# Patient Record
Sex: Male | Born: 1965
Health system: Southern US, Community
[De-identification: ages and names within clinical notes are randomized; demographics above are authoritative.]

## PROBLEM LIST (undated history)

## (undated) DIAGNOSIS — E119 Type 2 diabetes mellitus without complications: Secondary | ICD-10-CM

## (undated) DIAGNOSIS — M549 Dorsalgia, unspecified: Secondary | ICD-10-CM

## (undated) DIAGNOSIS — E559 Vitamin D deficiency, unspecified: Secondary | ICD-10-CM

## (undated) DIAGNOSIS — E1121 Type 2 diabetes mellitus with diabetic nephropathy: Secondary | ICD-10-CM

## (undated) DIAGNOSIS — J189 Pneumonia, unspecified organism: Secondary | ICD-10-CM

## (undated) DIAGNOSIS — E785 Hyperlipidemia, unspecified: Secondary | ICD-10-CM

## (undated) DIAGNOSIS — R7303 Prediabetes: Secondary | ICD-10-CM

## (undated) HISTORY — PX: ROTATOR CUFF REPAIR: SHX139

## (undated) HISTORY — DX: Vitamin D deficiency, unspecified: E55.9

## (undated) HISTORY — DX: Type 2 diabetes mellitus with diabetic nephropathy: E11.21

## (undated) HISTORY — PX: HAND SURGERY: SHX662

## (undated) HISTORY — PX: EYE SURGERY: SHX253

## (undated) HISTORY — PX: REPAIR KNEE LIGAMENT: SUR1188

## (undated) HISTORY — DX: Hyperlipidemia, unspecified: E78.5

---

## 2002-03-13 ENCOUNTER — Encounter: Admission: RE | Admit: 2002-03-13 | Discharge: 2002-04-19 | Payer: Self-pay | Admitting: Family Medicine

## 2006-07-26 ENCOUNTER — Emergency Department (HOSPITAL_COMMUNITY): Admission: EM | Admit: 2006-07-26 | Discharge: 2006-07-26 | Payer: Self-pay | Admitting: Emergency Medicine

## 2007-05-16 ENCOUNTER — Emergency Department (HOSPITAL_COMMUNITY): Admission: EM | Admit: 2007-05-16 | Discharge: 2007-05-17 | Payer: Self-pay | Admitting: Emergency Medicine

## 2007-09-18 ENCOUNTER — Emergency Department (HOSPITAL_COMMUNITY): Admission: EM | Admit: 2007-09-18 | Discharge: 2007-09-18 | Payer: Self-pay | Admitting: Emergency Medicine

## 2007-09-19 ENCOUNTER — Emergency Department (HOSPITAL_COMMUNITY): Admission: EM | Admit: 2007-09-19 | Discharge: 2007-09-19 | Payer: Self-pay | Admitting: Emergency Medicine

## 2009-02-01 ENCOUNTER — Emergency Department (HOSPITAL_COMMUNITY): Admission: EM | Admit: 2009-02-01 | Discharge: 2009-02-01 | Payer: Self-pay | Admitting: Emergency Medicine

## 2010-08-29 LAB — URINALYSIS, ROUTINE W REFLEX MICROSCOPIC
Leukocytes, UA: NEGATIVE
Nitrite: NEGATIVE
Specific Gravity, Urine: 1.03 (ref 1.005–1.030)
Urobilinogen, UA: 0.2 mg/dL (ref 0.0–1.0)
pH: 5 (ref 5.0–8.0)

## 2010-08-29 LAB — URINE MICROSCOPIC-ADD ON

## 2010-08-29 LAB — GLUCOSE, CAPILLARY: Glucose-Capillary: 279 mg/dL — ABNORMAL HIGH (ref 70–99)

## 2010-10-23 ENCOUNTER — Emergency Department (HOSPITAL_COMMUNITY)
Admission: EM | Admit: 2010-10-23 | Discharge: 2010-10-23 | Disposition: A | Discharge status: Home / Self Care | Payer: Self-pay | Attending: Emergency Medicine | Admitting: Emergency Medicine

## 2010-10-23 DIAGNOSIS — E119 Type 2 diabetes mellitus without complications: Secondary | ICD-10-CM | POA: Insufficient documentation

## 2010-10-23 DIAGNOSIS — K089 Disorder of teeth and supporting structures, unspecified: Secondary | ICD-10-CM | POA: Insufficient documentation

## 2012-05-24 ENCOUNTER — Emergency Department (HOSPITAL_COMMUNITY)
Admission: EM | Admit: 2012-05-24 | Discharge: 2012-05-24 | Disposition: A | Discharge status: Home / Self Care | Payer: Self-pay | Attending: Emergency Medicine | Admitting: Emergency Medicine

## 2012-05-24 ENCOUNTER — Emergency Department (HOSPITAL_COMMUNITY): Payer: Self-pay

## 2012-05-24 ENCOUNTER — Encounter (HOSPITAL_COMMUNITY): Payer: Self-pay | Admitting: *Deleted

## 2012-05-24 DIAGNOSIS — Z8701 Personal history of pneumonia (recurrent): Secondary | ICD-10-CM | POA: Insufficient documentation

## 2012-05-24 DIAGNOSIS — R059 Cough, unspecified: Secondary | ICD-10-CM | POA: Insufficient documentation

## 2012-05-24 DIAGNOSIS — J3489 Other specified disorders of nose and nasal sinuses: Secondary | ICD-10-CM | POA: Insufficient documentation

## 2012-05-24 DIAGNOSIS — R05 Cough: Secondary | ICD-10-CM

## 2012-05-24 DIAGNOSIS — R52 Pain, unspecified: Secondary | ICD-10-CM | POA: Insufficient documentation

## 2012-05-24 HISTORY — DX: Prediabetes: R73.03

## 2012-05-24 HISTORY — DX: Pneumonia, unspecified organism: J18.9

## 2012-05-24 HISTORY — DX: Dorsalgia, unspecified: M54.9

## 2012-05-24 MED ORDER — ALBUTEROL SULFATE HFA 108 (90 BASE) MCG/ACT IN AERS
2.0000 | INHALATION_SPRAY | Freq: Four times a day (QID) | RESPIRATORY_TRACT | Status: DC
  Filled 2012-05-24: qty 6.7

## 2012-05-24 MED ORDER — ALBUTEROL SULFATE (5 MG/ML) 0.5% IN NEBU
2.5000 mg | INHALATION_SOLUTION | RESPIRATORY_TRACT | Status: AC
  Administered 2012-05-24: 2.5 mg via RESPIRATORY_TRACT
  Filled 2012-05-24: qty 0.5

## 2012-05-24 MED ORDER — HYDROCODONE-HOMATROPINE 5-1.5 MG PO TABS: 1.0000 | ORAL_TABLET | Freq: Four times a day (QID) | ORAL | Status: DC | PRN

## 2012-05-24 NOTE — ED Notes (Signed)
Pt developed cough overnight, aches, fever, denies n/v/d, does have decreased appetite.

## 2012-05-24 NOTE — ED Provider Notes (Signed)
History  This chart was scribed for Brendan Munch, MD by Bennett Scrape, ED Scribe. This patient was seen in room APA19/APA19 and the patient's care was started at 3:46 PM.   CSN: [id number]  Arrival date & time 05/24/12  1512   First MD Initiated Contact with Patient 05/24/12 1546      Chief Complaint  Patient presents with  . Cough  . Nasal Congestion  . Generalized Body Aches    The history is provided by the patient. No language interpreter was used.    ORION MOLE is a 46 y.o. male who presents to the Emergency Department complaining of approximately 24 hours of gradual onset, gradually worsening, constant non-productive cough with associated low grade fever of 99 and diffuse HA. He reports chest pain and back pain with coughing but none without coughing. He states that the symptoms are similar to a prior PNA diagnosis 10 years ago. He reports sick contacts at home with similar symptoms.  He denies abdominal pain, diarrhea, nausea, emesis, confusion and CP without coughing as associated symptoms. He does not have a h/o chronic medical conditions. He is an occasional alcohol user but denies smoking.  Past Medical History  Diagnosis Date  . Back pain   . Pneumonia   . Pre-diabetes     Past Surgical History  Procedure Date  . Repair knee ligament     rt  . Rotator cuff repair     rt  . Hand surgery     rt    History reviewed. No pertinent family history.  History  Substance Use Topics  . Smoking status: Never Smoker   . Smokeless tobacco: Not on file  . Alcohol Use: Yes      Review of Systems  Constitutional:       Per HPI, otherwise negative  HENT:       Per HPI, otherwise negative  Eyes: Negative.   Respiratory:       Per HPI, otherwise negative  Cardiovascular:       Per HPI, otherwise negative  Gastrointestinal: Negative for vomiting.  Genitourinary: Negative.   Musculoskeletal:       Per HPI, otherwise negative  Skin: Negative.     Neurological: Positive for headaches. Negative for syncope.    Allergies  Review of patient's allergies indicates no known allergies.  Home Medications  No current outpatient prescriptions on file.  BP 150/84  Pulse 89  Temp 98.6 F (37 C) (Oral)  Resp 20  Ht 5\' 7"  (1.702 m)  Wt 210 lb (95.255 kg)  BMI 32.89 kg/m2  SpO2 100%  Physical Exam  Nursing note and vitals reviewed. Constitutional: He is oriented to person, place, and time. He appears well-developed and well-nourished. No distress.  HENT:  Head: Normocephalic and atraumatic.  Mouth/Throat: Oropharynx is clear and moist.  Eyes: Conjunctivae normal and EOM are normal. Pupils are equal, round, and reactive to light.  Cardiovascular: Normal rate and regular rhythm.   Pulmonary/Chest: Effort normal and breath sounds normal. No stridor. No respiratory distress.  Abdominal: Soft. He exhibits no distension.  Musculoskeletal: Normal range of motion. He exhibits no edema.  Neurological: He is alert and oriented to person, place, and time.  Skin: Skin is warm and dry.  Psychiatric: He has a normal mood and affect.    ED Course  Procedures (including critical care time)  DIAGNOSTIC STUDIES: Oxygen Saturation is 100% on room air, normal by my interpretation.    COORDINATION OF CARE: 4:02  PM- Discussed treatment plan which includes CXR and breathing treatment with pt at bedside and pt agreed to plan.  4:30 PM- Ordered 2.5 mg of 0.5% albuterol nebulizer solution.   Labs Reviewed - No data to display Dg Chest 2 View  05/24/2012  *RADIOLOGY REPORT*  Clinical Data: Shortness of breath with cough and fever.  CHEST - 2 VIEW  Comparison: 07/26/2006  Findings: The lungs are clear without focal consolidation, edema, effusion or pneumothorax.  Cardiopericardial silhouette is within normal limits for size.  Imaged bony structures of the thorax are intact.  IMPRESSION: Normal exam.   Original Report Authenticated By: Kennith Center,  M.D.      No diagnosis found.   On re-exam the patient is improved.  He remains HD stable MDM  This generally well-appearing male presents with one day of URI like symptoms.  On exam he is clinically stable, with no fever, mentating well.  The patient has clear lung sounds and with a negative x-ray, and clinical improvement, is appropriate for discharged with symptomatic treatment, PND followup of her presumed viral URI versus bronchitis.       Brendan Munch, MD 05/25/12 2110

## 2012-05-24 NOTE — ED Notes (Signed)
Pt states became sick with cough, low grade fever,headache and generalized body aches x 24 hrs. Denies n/v/d. Pt also denies chest pain without coughing. Pt is alert, oriented x 4 . NAD noted.  Dr Jeraldine Loots at bedside.

## 2014-12-11 ENCOUNTER — Emergency Department (HOSPITAL_COMMUNITY)
Admission: EM | Admit: 2014-12-11 | Discharge: 2014-12-11 | Disposition: A | Discharge status: Home / Self Care | Payer: Self-pay | Attending: Emergency Medicine | Admitting: Emergency Medicine

## 2014-12-11 ENCOUNTER — Encounter (HOSPITAL_COMMUNITY): Payer: Self-pay | Admitting: Emergency Medicine

## 2014-12-11 DIAGNOSIS — K029 Dental caries, unspecified: Secondary | ICD-10-CM | POA: Insufficient documentation

## 2014-12-11 DIAGNOSIS — K088 Other specified disorders of teeth and supporting structures: Secondary | ICD-10-CM | POA: Insufficient documentation

## 2014-12-11 DIAGNOSIS — K1379 Other lesions of oral mucosa: Secondary | ICD-10-CM | POA: Insufficient documentation

## 2014-12-11 DIAGNOSIS — K0889 Other specified disorders of teeth and supporting structures: Secondary | ICD-10-CM

## 2014-12-11 DIAGNOSIS — Z8701 Personal history of pneumonia (recurrent): Secondary | ICD-10-CM | POA: Insufficient documentation

## 2014-12-11 MED ORDER — PENICILLIN V POTASSIUM 250 MG PO TABS: 250.0000 mg | ORAL_TABLET | Freq: Four times a day (QID) | ORAL | Status: AC

## 2014-12-11 MED ORDER — IBUPROFEN 400 MG PO TABS
800.0000 mg | ORAL_TABLET | Freq: Once | ORAL | Status: AC
  Administered 2014-12-11: 800 mg via ORAL
  Filled 2014-12-11: qty 2

## 2014-12-11 MED ORDER — PENICILLIN V POTASSIUM 250 MG PO TABS
500.0000 mg | ORAL_TABLET | Freq: Once | ORAL | Status: AC
  Administered 2014-12-11: 500 mg via ORAL
  Filled 2014-12-11: qty 2

## 2014-12-11 NOTE — Discharge Instructions (Signed)
D[facility] Pain [facility] for [facility] pain and recurrent abscesses. Return for fever or facial swelling. A tooth ache may be caused by cavities (tooth decay). Cavities expose the nerve of the tooth to air and hot or cold temperatures. It may come from an infection or abscess (also called a boil or furuncle) around your tooth. It is also often caused by dental caries (tooth decay). This causes the pain you are having. DIAGNOSIS  Your caregiver can diagnose this problem by exam. TREATMENT   If caused by an infection, it may be treated with medications which kill germs (antibiotics) and pain medications as prescribed by your caregiver. Take medications as directed.  Only take over-the-counter or prescription medicines for pain, discomfort, or fever as directed by your caregiver.  Whether the tooth ache today is caused by infection or dental disease, you should see your dentist as soon as possible for further care. SEEK MEDICAL CARE IF: The exam and treatment you received today has been provided on an emergency basis only. This is not a substitute for complete medical or dental care. If your problem worsens or new problems (symptoms) appear, and you are unable to meet with your dentist, call or return to this location. SEEK IMMEDIATE MEDICAL CARE IF:   You have a fever.  You develop redness and swelling of your face, jaw, or neck.  You are unable to open your mouth.  You have severe pain uncontrolled by pain medicine. MAKE SURE YOU:   Understand these instructions.  Will watch your condition.  Will get help right away if you are not doing well or get worse. Document Released: 05/11/2005 Document Revised: 08/03/2011 Document Reviewed: 12/28/2007 [facility] Patient Information 2015 [employer] [place]. This information is not intended to replace advice given to you by your health care provider. Make sure you discuss any questions you have with your health care provider.
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
## 2014-12-11 NOTE — ED Provider Notes (Signed)
CSN: [id number]     Arrival date & time 12/11/14  3086 History  This chart was scribed for non-physician practitioner Catha Gosselin, PA-C working with Purvis Sheffield, MD by Lyndel Safe, ED Scribe. This patient was seen in room TR09C/TR09C and the patient's care was started at 10:05 AM.    Chief Complaint  Patient presents with  . Dental Pain   The history is provided by the patient. No language interpreter was used.   HPI Comments: Brendan Stone is a 49 y.o. male who presents to the Emergency Department complaining of a gradually worsening areas of pain and swelling in oral region onset 7 days ago. Pt reports he also has facial pain. His oral pain is exacerbated with touch. The pt has been popping the areas of infection with his finger and gargling with salt water. He reports he has a prior history of dental abscesses and poor dentition. He started taking a keflex prescription that he was prescribed on a prior visit to the ED as of yesterday. Pt states he is not followed by a dentist. He denies fevers or PMhx of renal issues/complications.    Past Medical History  Diagnosis Date  . Back pain   . Pneumonia   . Pre-diabetes    Past Surgical History  Procedure Laterality Date  . Repair knee ligament      rt  . Rotator cuff repair      rt  . Hand surgery      rt   No family history on file. History  Substance Use Topics  . Smoking status: Never Smoker   . Smokeless tobacco: Not on file  . Alcohol Use: Yes    Review of Systems  Constitutional: Negative for fever.  HENT: Positive for dental problem and mouth sores.    Allergies  Review of patient's allergies indicates no known allergies.  Home Medications   Prior to Admission medications   Medication Sig Start Date End Date Taking? Authorizing Provider  Hydrocodone-Homatropine 5-1.5 MG TABS Take 1 tablet by mouth every 6 (six) hours as needed. 05/24/12   Gerhard Munch, MD  penicillin v potassium (VEETID) 250 MG  tablet Take 1 tablet (250 mg total) by mouth 4 (four) times daily. 12/11/14 12/18/14  Delorese Sellin Patel-Mills, PA-C   BP 155/96 mmHg  Pulse 89  Temp(Src) 97.5 F (36.4 C) (Oral)  Resp 20  Ht  (1.727 m)  Wt 220 lb (99.791 kg)  BMI 33.46 kg/m2  SpO2 99% Physical Exam  Constitutional: He is oriented to person, place, and time. He appears well-developed and well-nourished. No distress.  HENT:  Head: Normocephalic and atraumatic.  Mouth/Throat: Uvula is midline, oropharynx is clear and moist and mucous membranes are normal. No trismus in the jaw. Abnormal dentition. Dental caries present. No dental abscesses or uvula swelling.  Dental carries throughout; no gum swelling; no facial swelling; no palpable fluctuance or abscess; TTP of the anterior upper gums; no new tooth avulsion or fracture.   Eyes: Conjunctivae are normal. Right eye exhibits no discharge. Left eye exhibits no discharge. No scleral icterus.  Neck: Neck supple. No JVD present.  Cardiovascular: Normal rate.   Pulmonary/Chest: Effort normal. No respiratory distress.  Patent airway. No difficulty breathing or shortness of breath. No drooling. No trismus.  Abdominal: Soft.  Musculoskeletal: Normal range of motion.  Neurological: He is alert and oriented to person, place, and time. Coordination normal.  Skin: Skin is warm and dry. No rash noted. No erythema. No pallor.  Psychiatric: He has a normal mood and affect. His behavior is normal.  Nursing note and vitals reviewed.  ED Course  Procedures  DIAGNOSTIC STUDIES: Oxygen Saturation is 99% on RA, normal by my interpretation.    COORDINATION OF CARE: 10:10 AM Discussed treatment plan which includes to order and prescribe penicillin and ibuprofen with pt. Will also give referral to a dentist. Pt acknowledges and agrees to plan.   Labs Review Labs Reviewed - No data to display  Imaging Review No results found.   EKG Interpretation None      MDM   Final diagnoses:   Pain, dental  Patient presents for dental pain. He states he gets recurrent gum abscesses and pops them himself. Medications  penicillin v potassium (VEETID) tablet 500 mg (500 mg Oral Given 12/11/14 1022)  ibuprofen (ADVIL,MOTRIN) tablet 800 mg (800 mg Oral Given 12/11/14 1022)  Today, he has no visible abscess but this could be due to him popping these areas with his nails. He has no signs of Ludwigs angina. I have not given him narcotic pain medication to go home with and he verbally agrees with the plan. I have however given him penicillin V and dentistry referral. I also gave the patient return precautions. I personally performed the services described in this documentation, which was scribed in my presence. The recorded information has been reviewed and is accurate.   Catha GosselinHanna Patel-Mills, PA-C 12/11/14 1642  Purvis SheffieldForrest Harrison, MD 12/12/14 [phone]

## 2014-12-11 NOTE — ED Notes (Signed)
Declined W/C at D/C and was escorted to lobby by RN. 

## 2015-03-23 ENCOUNTER — Encounter (HOSPITAL_COMMUNITY): Payer: Self-pay | Admitting: Emergency Medicine

## 2015-03-23 DIAGNOSIS — W1781XA Fall down embankment (hill), initial encounter: Secondary | ICD-10-CM | POA: Insufficient documentation

## 2015-03-23 DIAGNOSIS — S138XXA Sprain of joints and ligaments of other parts of neck, initial encounter: Secondary | ICD-10-CM | POA: Insufficient documentation

## 2015-03-23 DIAGNOSIS — Y998 Other external cause status: Secondary | ICD-10-CM | POA: Insufficient documentation

## 2015-03-23 DIAGNOSIS — Z8701 Personal history of pneumonia (recurrent): Secondary | ICD-10-CM | POA: Insufficient documentation

## 2015-03-23 DIAGNOSIS — S4992XA Unspecified injury of left shoulder and upper arm, initial encounter: Secondary | ICD-10-CM | POA: Insufficient documentation

## 2015-03-23 DIAGNOSIS — Y9289 Other specified places as the place of occurrence of the external cause: Secondary | ICD-10-CM | POA: Insufficient documentation

## 2015-03-23 DIAGNOSIS — Y9389 Activity, other specified: Secondary | ICD-10-CM | POA: Insufficient documentation

## 2015-03-23 DIAGNOSIS — S4991XA Unspecified injury of right shoulder and upper arm, initial encounter: Secondary | ICD-10-CM | POA: Insufficient documentation
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
## 2015-03-24 ENCOUNTER — Emergency Department (HOSPITAL_COMMUNITY)
Admission: EM | Admit: 2015-03-24 | Discharge: 2015-03-24 | Disposition: A | Discharge status: Home / Self Care | Payer: Self-pay | Attending: Emergency Medicine | Admitting: Emergency Medicine

## 2015-03-24 ENCOUNTER — Emergency Department (HOSPITAL_COMMUNITY): Payer: Self-pay

## 2015-03-24 DIAGNOSIS — S139XXA Sprain of joints and ligaments of unspecified parts of neck, initial encounter: Secondary | ICD-10-CM

## 2015-03-24 MED ORDER — HYDROCODONE-ACETAMINOPHEN 5-325 MG PO TABS: 1.0000 | ORAL_TABLET | ORAL | Status: DC | PRN

## 2015-03-24 MED ORDER — NAPROXEN 375 MG PO TABS: 375.0000 mg | ORAL_TABLET | Freq: Two times a day (BID) | ORAL | Status: DC

## 2015-03-24 MED ORDER — CYCLOBENZAPRINE HCL 10 MG PO TABS
5.0000 mg | ORAL_TABLET | Freq: Two times a day (BID) | ORAL | Status: DC | PRN
Start: 2015-03-24 — End: 2016-03-28

## 2015-03-24 MED ORDER — ACETAMINOPHEN 325 MG PO TABS
650.0000 mg | ORAL_TABLET | Freq: Once | ORAL | Status: AC
  Administered 2015-03-24: 650 mg via ORAL
  Filled 2015-03-24: qty 2

## 2015-03-24 NOTE — ED Notes (Signed)
Patient transported to CT 

## 2015-03-24 NOTE — Discharge Instructions (Signed)
Cervical Collar A cervical collar is a device that supports your chin and the back of your head. It is used after a severe neck injury to protect your head and neck. It does this by restricting the movement of the top part of your spine, which is located in your neck. A cervical collar may be used when you have:  A fractured neck.  Ligament damage.  A spinal cord injury. WHAT INSTRUCTIONS SHOULD I FOLLOW?  Wear the collar for as long as your health care provider instructs.  Follow your health care provider's instructions about how to put on and take off your collar.  Do not make your collar so tight that you feel pain or it is hard for you to breathe.  Do not remove the collar unless your health care provider says it is okay. Ask your health care provider if you can remove the collar for showering or eating or to apply ice.  Do not drive a car until your health care provider says it is okay.  Keep all follow-up visits as directed by your health care provider. This is important. Any delay in getting necessary care can keep your injury from healing properly.  Apply ice to the injured area:  Put ice in a plastic bag.  Place a towel between your skin and the bag.  Leave the ice on for 20 minutes, 2-3 times per day for the first 2 days.   This information is not intended to replace advice given to you by your health care provider. Make sure you discuss any questions you have with your health care provider.   Document Released: 02/01/2004 Document Revised: 06/01/2014 Document Reviewed: 12/18/2013 Elsevier Interactive Patient Education 2016 [employer] Cervical Sprain A cervical sprain is an injury in the neck in which the strong, fibrous tissues (ligaments) that connect your neck bones stretch or tear. Cervical sprains can range from mild to severe. Severe cervical sprains can cause the neck vertebrae to be unstable. This can lead to damage of the spinal cord and can result in serious  nervous system problems. The amount of time it takes for a cervical sprain to get better depends on the cause and extent of the injury. Most cervical sprains heal in 1 to 3 weeks. CAUSES  Severe cervical sprains may be caused by:   Contact sport injuries (such as from football, rugby, wrestling, hockey, auto racing, gymnastics, diving, martial arts, or boxing).   Motor vehicle collisions.   Whiplash injuries. This is an injury from a sudden forward and backward whipping movement of the head and neck.  Falls.  Mild cervical sprains may be caused by:   Being in an awkward position, such as while cradling a telephone between your ear and shoulder.   Sitting in a chair that does not offer proper support.   Working at a poorly Marketing executivedesigned computer station.   Looking up or down for long periods of time.  SYMPTOMS   Pain, soreness, stiffness, or a burning sensation in the front, back, or sides of the neck. This discomfort may develop immediately after the injury or slowly, 24 hours or more after the injury.   Pain or tenderness directly in the middle of the back of the neck.   Shoulder or upper back pain.   Limited ability to move the neck.   Headache.   Dizziness.   Weakness, numbness, or tingling in the hands or arms.   Muscle spasms.   Difficulty swallowing or chewing.  Tenderness and swelling of the neck.  DIAGNOSIS  Most of the time your health care provider can diagnose a cervical sprain by taking your history and doing a physical exam. Your health care provider will ask about previous neck injuries and any known neck problems, such as arthritis in the neck. X-rays may be taken to find out if there are any other problems, such as with the bones of the neck. Other tests, such as a CT scan or MRI, may also be needed.  TREATMENT  Treatment depends on the severity of the cervical sprain. Mild sprains can be treated with rest, keeping the neck in place  (immobilization), and pain medicines. Severe cervical sprains are immediately immobilized. Further treatment is done to help with pain, muscle spasms, and other symptoms and may include:  Medicines, such as pain relievers, numbing medicines, or muscle relaxants.   Physical therapy. This may involve stretching exercises, strengthening exercises, and posture training. Exercises and improved posture can help stabilize the neck, strengthen muscles, and help stop symptoms from returning.  HOME CARE INSTRUCTIONS   Put ice on the injured area.   Put ice in a plastic bag.   Place a towel between your skin and the bag.   Leave the ice on for 15-20 minutes, 3-4 times a day.   If your injury was severe, you may have been given a cervical collar to wear. A cervical collar is a two-piece collar designed to keep your neck from moving while it heals.  Do not remove the collar unless instructed by your health care provider.  If you have long hair, keep it outside of the collar.  Ask your health care provider before making any adjustments to your collar. Minor adjustments may be required over time to improve comfort and reduce pressure on your chin or on the back of your head.  Ifyou are allowed to remove the collar for cleaning or bathing, follow your health care provider's instructions on how to do so safely.  Keep your collar clean by wiping it with mild soap and water and drying it completely. If the collar you have been given includes removable pads, remove them every 1-2 days and hand wash them with soap and water. Allow them to air dry. They should be completely dry before you wear them in the collar.  If you are allowed to remove the collar for cleaning and bathing, wash and dry the skin of your neck. Check your skin for irritation or sores. If you see any, tell your health care provider.  Do not drive while wearing the collar.   Only take over-the-counter or prescription medicines for  pain, discomfort, or fever as directed by your health care provider.   Keep all follow-up appointments as directed by your health care provider.   Keep all physical therapy appointments as directed by your health care provider.   Make any needed adjustments to your workstation to promote good posture.   Avoid positions and activities that make your symptoms worse.   Warm up and stretch before being active to help prevent problems.  SEEK MEDICAL CARE IF:   Your pain is not controlled with medicine.   You are unable to decrease your pain medicine over time as planned.   Your activity level is not improving as expected.  SEEK IMMEDIATE MEDICAL CARE IF:   You develop any bleeding.  You develop stomach upset.  You have signs of an allergic reaction to your medicine.   Your symptoms get  worse.   You develop new, unexplained symptoms.   You have numbness, tingling, weakness, or paralysis in any part of your body.  MAKE SURE YOU:   Understand these instructions.  Will watch your condition.  Will get help right away if you are not doing well or get worse.   This information is not intended to replace advice given to you by your health care provider. Make sure you discuss any questions you have with your health care provider.   Document Released: 03/08/2007 Document Revised: 05/16/2013 Document Reviewed: 11/16/2012 Elsevier Interactive Patient Education [employer]

## 2015-03-24 NOTE — ED Provider Notes (Signed)
CSN: [id number]     Arrival date & time 03/23/15  2243 History   First MD Initiated Contact with Patient 03/24/15 0100     Chief Complaint  Patient presents with  . Fall     (Consider location/radiation/quality/duration/timing/severity/associated sxs/prior Treatment) HPI  Brendan Stone is a 49 y.o. male  PCP: No primary care provider on file.  Blood pressure 137/82, pulse 86, temperature 98.2 F (36.8 C), temperature source Oral, resp. rate 16, height  (1.702 m), weight 232 lb (105.235 kg).  SIGNIFICANT PMH: back pain, pneumonia, pre-diabetes CHIEF COMPLAINT: fall, neck pain and upper back pain  The patient reports tripping and falling down 15-20 foot embankment, doing one flip, hearing his neck pop and then landing on his back. He did not have LOC, he did not injure his head. He says that he laid there for 30 minutes before getting up on his own and driving himself to the ER. He reports significant neck pain and bilateral shoulder blade pain. HE is not having any hip, low back, abdominal or chest wall pain. He denies having a headache. NO weakness, numbness or tingling to upper arms, no change of vision, wound injuries or chest pains. NO loss or bowel or bladder control  c-collar was placed in triage. Pt reports general myalgias.   Past Medical History  Diagnosis Date  . Back pain   . Pneumonia   . Pre-diabetes    Past Surgical History  Procedure Laterality Date  . Repair knee ligament      rt  . Rotator cuff repair      rt  . Hand surgery      rt   No family history on file. Social History  Substance Use Topics  . Smoking status: Never Smoker   . Smokeless tobacco: None  . Alcohol Use: Yes    Review of Systems  10 Systems reviewed and are negative for acute change except as noted in the HPI.    Allergies  Review of patient's allergies indicates no known allergies.  Home Medications   Prior to Admission medications   Medication Sig Start Date End  Date Taking? Authorizing Provider  Hydrocodone-Homatropine 5-1.5 MG TABS Take 1 tablet by mouth every 6 (six) hours as needed. 05/24/12   Gerhard Munch, MD   BP 137/82 mmHg  Pulse 86  Temp(Src) 98.2 F (36.8 C) (Oral)  Resp 16  Ht  (1.702 m)  Wt 232 lb (105.235 kg)  BMI 36.33 kg/m2 Physical Exam  Constitutional: Oriented to person, place, and time. Appears well-developed and well-nourished.  HENT:  Head: Normocephalic.  Eyes: EOM are normal.  Neck: Normal range of motion.   + paraspinal cervical tenderness and midline tenderss Patient is in c-collar. He reports that on the way to the ED he did not want to flex or rotate his neck due to severe pain. Physiologic and symmetrical strength to bilateral upper extremities. Pulmonary/Chest: Effort normal.  No contusions to chest wall No crepitus over neck or chest, no flail chest Abdominal: Soft. Exhibits no distension. There is no tenderness.  Anterior abdomen- No significant ecchymosis No flank tenderness, no ecchymosis abdominal wall.  Musculoskeletal: Normal range of motion.  No neurologic deficit No TTP of upper extremities No gross deformities No tenderness over the thoracic spine No new tenderness over the lumbar spine No step-offs  Neurological: Alert and oriented to person, place, and time.  Psychiatric: Has a normal mood and affect.  Nursing note and vitals reviewed.  ED Course  Procedures (including critical care time) Labs Review Labs Reviewed - No data to display  Imaging Review Dg Cervical Spine Complete  03/24/2015  CLINICAL DATA:  Pain following fall EXAM: CERVICAL SPINE - COMPLETE 4+ VIEW COMPARISON:  None. FINDINGS: Frontal, lateral, open-mouth odontoid, and bilateral oblique views were obtained with the patient's neck in collar. There is no demonstrable fracture or spondylolisthesis. Prevertebral soft tissues and predental space regions are normal. There is mild disc space narrowing at C5-6 and C6-7.  There are small anterior osteophytes at C5 and C6. There is no appreciable exit foraminal narrowing on the oblique views. There is mild upper thoracic levoscoliosis. IMPRESSION: No fracture or spondylolisthesis. Mild osteoarthritic change. Mild upper thoracic levoscoliosis. It should be noted that no attempt at assessment for potential ligamentous injury can be made with in collar only images. Electronically Signed   By: Bretta BangWilliam  Woodruff III M.D.   On: 03/24/2015 01:07   Dg Thoracic Spine 2 View  03/24/2015  CLINICAL DATA:  Pain following fall EXAM: THORACIC SPINE -3VIEWS COMPARISON:  None. FINDINGS: Frontal, lateral, and swimmer's views were obtained. There is slight thoracic levoscoliosis. There is no fracture or spondylolisthesis. There is mild to moderate disc space narrowing at multiple levels. There are multiple anterior lateral osteophytes. No erosive change. IMPRESSION: Osteoarthritic change at multiple levels. No fracture or spondylolisthesis. Slight scoliosis. Electronically Signed   By: Bretta BangWilliam  Woodruff III M.D.   On: 03/24/2015 01:08   I have personally reviewed and evaluated these images and lab results as part of my medical decision-making.   EKG Interpretation None      MDM   Final diagnoses:  None   Patient is having neck pain with decreased ROM, he had a plain film done per triage but since he has had a neck injury in the past it is not sufficient to r/o any injury. Pt still in rigid c-collar and a CT of the cervical spine has been ordered. Pt does not meet criteria for emergent MRI at this time with no decreased strength or neurological deficits.   He was given Tylenol for pain, declined any stronger of pain medication. At end of shift patient care handed off to Elpidio AnisShari Upstill, PA-C  If CT is not acute, would recommend order of soft c-collar for comfort.-- discussed plan with patient.     Marlon Peliffany Kiyanna Biegler, PA-C 03/24/15 [id number]  Layla MawKristen N Ward, DO 03/24/15 [id number]

## 2015-03-24 NOTE — ED Notes (Signed)
Patient transported to X-ray 

## 2015-11-24 ENCOUNTER — Encounter (HOSPITAL_COMMUNITY): Payer: Self-pay | Admitting: Emergency Medicine

## 2015-11-24 DIAGNOSIS — S60862A Insect bite (nonvenomous) of left wrist, initial encounter: Secondary | ICD-10-CM | POA: Insufficient documentation

## 2015-11-24 DIAGNOSIS — Y939 Activity, unspecified: Secondary | ICD-10-CM | POA: Insufficient documentation

## 2015-11-24 DIAGNOSIS — R110 Nausea: Secondary | ICD-10-CM | POA: Insufficient documentation

## 2015-11-24 DIAGNOSIS — M542 Cervicalgia: Secondary | ICD-10-CM | POA: Insufficient documentation

## 2015-11-24 DIAGNOSIS — Y999 Unspecified external cause status: Secondary | ICD-10-CM | POA: Insufficient documentation

## 2015-11-24 DIAGNOSIS — W57XXXA Bitten or stung by nonvenomous insect and other nonvenomous arthropods, initial encounter: Secondary | ICD-10-CM | POA: Insufficient documentation

## 2015-11-24 DIAGNOSIS — Y929 Unspecified place or not applicable: Secondary | ICD-10-CM | POA: Insufficient documentation

## 2015-11-24 NOTE — ED Notes (Signed)
Pt. reports insect bite to left wrist this evening with mild itching , reports nausea with pain at back of neck , denies fever /respirations unlabored /no oral swelling .

## 2015-11-25 ENCOUNTER — Emergency Department (HOSPITAL_COMMUNITY)
Admission: EM | Admit: 2015-11-25 | Discharge: 2015-11-25 | Disposition: A | Discharge status: Home / Self Care | Payer: Self-pay | Attending: Emergency Medicine | Admitting: Emergency Medicine

## 2015-11-25 DIAGNOSIS — R110 Nausea: Secondary | ICD-10-CM

## 2015-11-25 DIAGNOSIS — W57XXXA Bitten or stung by nonvenomous insect and other nonvenomous arthropods, initial encounter: Secondary | ICD-10-CM

## 2015-11-25 DIAGNOSIS — M542 Cervicalgia: Secondary | ICD-10-CM

## 2015-11-25 MED ORDER — IBUPROFEN 800 MG PO TABS
800.0000 mg | ORAL_TABLET | Freq: Once | ORAL | Status: AC
  Administered 2015-11-25: 800 mg via ORAL
  Filled 2015-11-25: qty 1

## 2015-11-25 MED ORDER — ONDANSETRON 4 MG PO TBDP
4.0000 mg | ORAL_TABLET | Freq: Once | ORAL | Status: AC
  Administered 2015-11-25: 4 mg via ORAL
  Filled 2015-11-25: qty 1

## 2015-11-25 NOTE — ED Provider Notes (Signed)
CSN: [id number]     Arrival date & time 11/24/15  2338 History   First MD Initiated Contact with Patient 11/25/15 0025     Chief Complaint  Patient presents with  . Insect Bite   HPI  Mr. Brendan Stone is a 50 year old male presenting with an insect bite, nausea and pain. Patient states that he was bitten by a small black spider on his left wrist earlier this evening. Denies that it was a black widow or brown recluse. He notes an area of redness to the wrist that has become pruritic. Denies the area of redness enlarging in size over the past 3 hours. Denies streaking or drainage from the insect bite. He states that soon after the insect bite he developed nausea without vomiting. He also notes that at that time he developed posterior neck pain. No history of neck injury. He describes this neck pain as a "muscle pull; like a tightness". The neck pain is not exacerbated by movement of the neck. The neck pain does not radiate. He denies numbness or weakness of the upper extremities. Denies known allergies. Denies headache, dizziness, syncope, facial swelling, sensation of throat closing, shortness of breath, wheezing, abdominal pain or diarrhea. He has not taken any medications prior to arrival. Denies fevers. He has no other complaints today.  Past Medical History  Diagnosis Date  . Back pain   . Pneumonia   . Pre-diabetes    Past Surgical History  Procedure Laterality Date  . Repair knee ligament      rt  . Rotator cuff repair      rt  . Hand surgery      rt   No family history on file. Social History  Substance Use Topics  . Smoking status: Never Smoker   . Smokeless tobacco: None  . Alcohol Use: Yes    Review of Systems  All other systems reviewed and are negative.     Allergies  Review of patient's allergies indicates no known allergies.  Home Medications   Prior to Admission medications   Medication Sig Start Date End Date Taking? Authorizing Provider  cyclobenzaprine (FLEXERIL)  10 MG tablet Take 0.5-1 tablets (5-10 mg total) by mouth 2 (two) times daily as needed. 03/24/15   Marlon Peliffany Greene, PA-C  HYDROcodone-acetaminophen (NORCO/VICODIN) 5-325 MG tablet Take 1-2 tablets by mouth every 4 (four) hours as needed. 03/24/15   Tiffany Neva SeatGreene, PA-C  Hydrocodone-Homatropine 5-1.5 MG TABS Take 1 tablet by mouth every 6 (six) hours as needed. 05/24/12   Gerhard Munchobert Lockwood, MD  naproxen (NAPROSYN) 375 MG tablet Take 1 tablet (375 mg total) by mouth 2 (two) times daily. 03/24/15   Tiffany Neva SeatGreene, PA-C   BP 130/92 mmHg  Pulse 92  Temp(Src) 98.1 F (36.7 C) (Oral)  Resp 18  Ht 5\' 8"  (1.727 m)  Wt 95.255 kg  BMI 31.94 kg/m2  SpO2 99% Physical Exam  Constitutional: He appears well-developed and well-nourished. No distress.  Nontoxic-appearing  HENT:  Head: Normocephalic and atraumatic.  Mouth/Throat: Oropharynx is clear and moist.  No eyelid, lip or tongue edema. Oropharynx is clear without edema or erythema. Uvula is midline without edema. Pt handling secretions well.   Eyes: Conjunctivae are normal. Right eye exhibits no discharge. Left eye exhibits no discharge.  Neck: Normal range of motion. Neck supple.  No tenderness to palpation of the posterior neck. FROM of neck intact without pain. No rigidity or meningeal signs.  Cardiovascular: Normal rate, regular rhythm and normal heart sounds.  Pulmonary/Chest: Effort normal and breath sounds normal. No respiratory distress. He has no wheezes. He has no rales.  Breathing unlabored. Lungs CTAB  Abdominal: Soft. He exhibits no distension. There is no tenderness.  Musculoskeletal: Normal range of motion.  Moves all extremities spontaneously  Neurological: He is alert. Coordination normal.  Skin: Skin is warm and dry. No rash noted.     Circular 3-4 millimeter erythematous papule noted to the left distal forearm radial aspect. No streaking. No central ulceration. No drainage, fluctuance or induration. Area is not tender to  palpation.  Psychiatric: He has a normal mood and affect. His behavior is normal.  Nursing note and vitals reviewed.   ED Course  Procedures (including critical care time) Labs Review Labs Reviewed - No data to display  Imaging Review No results found. I have personally reviewed and evaluated these images and lab results as part of my medical decision-making.   EKG Interpretation None      MDM   Final diagnoses:  Insect bite  Neck pain  Nausea   50 year old male presenting with pruritic bug bite with subsequent development of nausea and posterior neck pain. Afebrile and hemodynamically stable. Small erythematous papule noted to the left wrist without signs of superficial infection. Neck is nontender to palpation with full range of motion intact. No meningeal signs. Abdomen is soft, nontender. Patient is nontoxic, nonseptic appearing. No gross neuro deficits. Nausea and neck pain treated with Zofran and ibuprofen and emergency department. Recommended Benadryl for itching. Discussed signs and symptoms of skin infection for which patient should return to the emergency department. I have also given neck pain return precautions. Patient does not have a PCP. Given referral information for local primary care providers and instructed to follow up within the next week for a recheck of his bug bite and to insure symptom resolution.  At this time there does not appear to be any evidence of an acute emergency medical condition and the patient appears stable for discharge with appropriate outpatient follow up. Diagnosis was discussed with patient who verbalizes understanding and is agreeable to discharge. Return precautions given in discharge paperwork and discussed with pt at bedside. Pt is stable for discharge.    Rolm GalaStevi Jeancarlo Leffler, PA-C 11/25/15 [id number]  Tomasita CrumbleAdeleke Oni, MD 11/25/15 [phone]

## 2015-11-25 NOTE — Discharge Instructions (Signed)
Nausea, Adult Nausea is the feeling that you have an upset stomach or have to vomit. Nausea by itself is not likely a serious concern, but it may be an early sign of more serious medical problems. As nausea gets worse, it can lead to vomiting. If vomiting develops, there is the risk of dehydration.  CAUSES   Viral infections.  Food poisoning.  Medicines.  Pregnancy.  Motion sickness.  Migraine headaches.  Emotional distress.  Severe pain from any source.  Alcohol intoxication. HOME CARE INSTRUCTIONS  Get plenty of rest.  Ask your caregiver about specific rehydration instructions.  Eat small amounts of food and sip liquids more often.  Take all medicines as told by your caregiver. SEEK MEDICAL CARE IF:  You have not improved after 2 days, or you get worse.  You have a headache. SEEK IMMEDIATE MEDICAL CARE IF:   You have a fever.  You faint.  You keep vomiting or have blood in your vomit.  You are extremely weak or dehydrated.  You have dark or bloody stools.  You have severe chest or abdominal pain. MAKE SURE YOU:  Understand these instructions.  Will watch your condition.  Will get help right away if you are not doing well or get worse.   This information is not intended to replace advice given to you by your health care provider. Make sure you discuss any questions you have with your health care provider.   Document Released: 06/18/2004 Document Revised: 06/01/2014 Document Reviewed: 01/21/2011 Elsevier Interactive Patient Education 2016 [employer] Insect Bite Mosquitoes, flies, fleas, bedbugs, and many other insects can bite. Insect bites are different from insect stings. A sting is when poison (venom) is injected into the skin. Insect bites can cause pain or itching for a few days, but they are usually not serious. Some insects can spread diseases to people through a bite. SYMPTOMS  Symptoms of an insect bite include:  Itching or pain in the  bite area.  Redness and swelling in the bite area.  An open wound (skin ulcer). In many cases, symptoms last for 2-4 days.  DIAGNOSIS  This condition is usually diagnosed based on symptoms and a physical exam. TREATMENT  Treatment is usually not needed for an insect bite. Symptoms often go away on their own. Your health care provider may recommend creams or lotions to help reduce itching. Antibiotic medicines may be prescribed if the bite becomes infected. A tetanus shot may be given in some cases. If you develop an allergic reaction to an insect bite, your health care provider will prescribe medicines to treat the reaction (antihistamines). This is rare. HOME CARE INSTRUCTIONS  Do not scratch the bite area.  Keep the bite area clean and dry. Wash the bite area daily with soap and water as told by your health care provider.  If directed, applyice to the bite area.  Put ice in a plastic bag.  Place a towel between your skin and the bag.  Leave the ice on for 20 minutes, 2-3 times per day.  To help reduce itching and swelling, try applying a baking soda paste, cortisone cream, or calamine lotion to the bite area as told by your health care provider.  Apply or take over-the-counter and prescription medicines only as told by your health care provider.  If you were prescribed an antibiotic medicine, use it as told by your health care provider. Do not stop using the antibiotic even if your condition improves.  Keep all follow-up visits  as told by your health care provider. This is important. PREVENTION   Use insect repellent. The best insect repellents contain:  DEET, picaridin, oil of lemon eucalyptus (OLE), or IR3535.  Higher amounts of an active ingredient.  When you are outdoors, wear clothing that covers your arms and legs.  Avoid opening windows that do not have window screens. SEEK MEDICAL CARE IF:  You have increased redness, swelling, or pain in the bite area.  You  have a fever. SEEK IMMEDIATE MEDICAL CARE IF:   You have joint pain.   You have fluid, blood, or pus coming from the bite area.  You have a headache or neck pain.  You have unusual weakness.  You have a rash.  You have chest pain or shortness of breath.  You have abdominal pain, nausea, or vomiting.  You feel unusually tired or sleepy.   This information is not intended to replace advice given to you by your health care provider. Make sure you discuss any questions you have with your health care provider.   Document Released: 06/18/2004 Document Revised: 01/30/2015 Document Reviewed: 09/26/2014 Elsevier Interactive Patient Education [employer]2016 Elsevier Inc.

## 2016-03-28 ENCOUNTER — Emergency Department (HOSPITAL_COMMUNITY)
Admission: EM | Admit: 2016-03-28 | Discharge: 2016-03-28 | Disposition: A | Discharge status: Home / Self Care | Payer: Self-pay | Attending: Emergency Medicine | Admitting: Emergency Medicine

## 2016-03-28 ENCOUNTER — Emergency Department (HOSPITAL_COMMUNITY): Payer: Self-pay

## 2016-03-28 ENCOUNTER — Encounter (HOSPITAL_COMMUNITY): Payer: Self-pay | Admitting: Emergency Medicine

## 2016-03-28 DIAGNOSIS — J40 Bronchitis, not specified as acute or chronic: Secondary | ICD-10-CM | POA: Insufficient documentation

## 2016-03-28 MED ORDER — PREDNISONE 10 MG PO TABS: ORAL_TABLET | ORAL | 0 refills | Status: DC

## 2016-03-28 MED ORDER — HYDROCODONE-ACETAMINOPHEN 5-325 MG PO TABS
1.0000 | ORAL_TABLET | Freq: Once | ORAL | Status: AC
  Administered 2016-03-28: 1 via ORAL
  Filled 2016-03-28: qty 1

## 2016-03-28 MED ORDER — GUAIFENESIN-CODEINE 100-10 MG/5ML PO SYRP: 10.0000 mL | ORAL_SOLUTION | Freq: Three times a day (TID) | ORAL | 0 refills | Status: DC | PRN

## 2016-03-28 MED ORDER — IPRATROPIUM-ALBUTEROL 0.5-2.5 (3) MG/3ML IN SOLN
3.0000 mL | Freq: Once | RESPIRATORY_TRACT | Status: AC
  Administered 2016-03-28: 3 mL via RESPIRATORY_TRACT
  Filled 2016-03-28: qty 3

## 2016-03-28 MED ORDER — PREDNISONE 50 MG PO TABS
60.0000 mg | ORAL_TABLET | Freq: Once | ORAL | Status: AC
  Administered 2016-03-28: 60 mg via ORAL
  Filled 2016-03-28: qty 1

## 2016-03-28 MED ORDER — ALBUTEROL SULFATE HFA 108 (90 BASE) MCG/ACT IN AERS
2.0000 | INHALATION_SPRAY | Freq: Once | RESPIRATORY_TRACT | Status: AC
  Administered 2016-03-28: 2 via RESPIRATORY_TRACT
  Filled 2016-03-28: qty 6.7

## 2016-03-28 NOTE — Discharge Instructions (Signed)
1-2 puffs of the inhaler every 4-6 hrs as needed.  Start the prednisone tomorrow.  Return to ER for any worsening symptoms

## 2016-03-28 NOTE — ED Notes (Signed)
Respiratory paged at this time.

## 2016-03-28 NOTE — ED Triage Notes (Signed)
Pt reports non productive cough for 10 days. Pt denies fever or chills. Pt states he has had prior hx of pneumonia. Pt states he "feels good except for the cough and soreness in his ribs and chest."

## 2016-03-31 NOTE — ED Provider Notes (Signed)
[facility]-EMERGENCY DEPT Provider Note   CSN: [id number] Arrival date & time: 03/28/16  1959     History   Chief Complaint Chief Complaint  Patient presents with  . Cough    HPI Brendan Stone is a 50 y.o. male.  HPI   Brendan Stone is a 50 y.o. male who presents to the Emergency Department complaining of persistent cough for 10 days.  He states the cough is dry and worse with talking or exertion.  He has tried OTC cough medication without relief.  He describes a "soreness" of both sides of his ribs associated with cough and wheezing.  He denies fever, shortness of breath, abdominal pain, hemoptysis.     Past Medical History:  Diagnosis Date  . Back pain   . Pneumonia   . Pre-diabetes     There are no active problems to display for this patient.   Past Surgical History:  Procedure Laterality Date  . HAND SURGERY     rt  . REPAIR KNEE LIGAMENT     rt  . ROTATOR CUFF REPAIR     rt       Home Medications    Prior to Admission medications   Medication Sig Start Date End Date Taking? Authorizing Provider  guaiFENesin-codeine (ROBITUSSIN AC) 100-10 MG/5ML syrup Take 10 mLs by mouth 3 (three) times daily as needed. 03/28/16   Keveon Amsler, PA-C  predniSONE (DELTASONE) 10 MG tablet Take 6 tablets day one, 5 tablets day two, 4 tablets day three, 3 tablets day four, 2 tablets day five, then 1 tablet day six 03/28/16   Pauline Ausammy Janetta Vandoren, PA-C    Family History Family History  Problem Relation Age of Onset  . Cancer Mother   . Heart disease Father   . Diabetes Father     Social History Social History  Substance Use Topics  . Smoking status: Never Smoker  . Smokeless tobacco: Never Used  . Alcohol use Yes     Comment: occas     Allergies   Tape   Review of Systems Review of Systems  Constitutional: Negative for appetite change, chills and fever.  HENT: Positive for congestion. Negative for sore throat and trouble swallowing.   Respiratory: Positive for cough  and wheezing. Negative for chest tightness and shortness of breath.   Cardiovascular: Negative for chest pain.  Gastrointestinal: Negative for abdominal pain, nausea and vomiting.  Genitourinary: Negative for dysuria.  Musculoskeletal: Negative for arthralgias.  Skin: Negative for rash.  Neurological: Negative for dizziness, weakness and numbness.  Hematological: Negative for adenopathy.  All other systems reviewed and are negative.    Physical Exam Updated Vital Signs BP 162/87   Pulse 74   Temp 98 F (36.7 C) (Oral)   Resp 18   Ht 5\' 8"  (1.727 m)   Wt 95.3 kg   SpO2 99%   BMI 31.93 kg/m   Physical Exam  Constitutional: He is oriented to person, place, and time. He appears well-developed and well-nourished. No distress.  HENT:  Head: Normocephalic and atraumatic.  Right Ear: Tympanic membrane and ear canal normal.  Left Ear: Tympanic membrane and ear canal normal.  Mouth/Throat: Uvula is midline, oropharynx is clear and moist and mucous membranes are normal. No oropharyngeal exudate.  Eyes: EOM are normal. Pupils are equal, round, and reactive to light.  Neck: Normal range of motion, full passive range of motion without pain and phonation normal. Neck supple.  Cardiovascular: Normal rate, regular rhythm, normal heart sounds  and intact distal pulses.   No murmur heard. Pulmonary/Chest: Effort normal. No stridor. No respiratory distress. He has wheezes. He has no rales. He exhibits no tenderness.  Coarse lungs sounds bilaterally with scattered inspir wheezes.  No rales  Abdominal: Soft. He exhibits no distension. There is no tenderness. There is no guarding.  Musculoskeletal: Normal range of motion. He exhibits no edema.  Lymphadenopathy:    He has no cervical adenopathy.  Neurological: He is alert and oriented to person, place, and time. He exhibits normal muscle tone. Coordination normal.  Skin: Skin is warm and dry.  Nursing note and vitals reviewed.    ED Treatments  / Results  Labs (all labs ordered are listed, but only abnormal results are displayed) Labs Reviewed - No data to display  EKG  EKG Interpretation None       Radiology Dg Chest 2 View  Result Date: 03/28/2016 CLINICAL DATA:  Nonproductive cough for 10 days. EXAM: CHEST  2 VIEW COMPARISON:  05/24/2012 FINDINGS: The heart size and mediastinal contours are within normal limits. Both lungs are clear. The visualized skeletal structures are unremarkable. IMPRESSION: No active cardiopulmonary disease. Electronically Signed   By: Myles RosenthalJohn  Stahl M.D.   On: 03/28/2016 21:08     Procedures Procedures (including critical care time)  Medications Ordered in ED Medications  ipratropium-albuterol (DUONEB) 0.5-2.5 (3) MG/3ML nebulizer solution 3 mL (3 mLs Nebulization Given 03/28/16 2115)  albuterol (PROVENTIL HFA;VENTOLIN HFA) 108 (90 Base) MCG/ACT inhaler 2 puff (2 puffs Inhalation Given 03/28/16 2120)  predniSONE (DELTASONE) tablet 60 mg (60 mg Oral Given 03/28/16 2104)  HYDROcodone-acetaminophen (NORCO/VICODIN) 5-325 MG per tablet 1 tablet (1 tablet Oral Given 03/28/16 2206)     Initial Impression / Assessment and Plan / ED Course  I have reviewed the triage vital signs and the nursing notes.  Pertinent labs & imaging results that were available during my care of the patient were reviewed by me and considered in my medical decision making (see chart for details).  Clinical Course     Pt is feeling better, wheezing improved.  Vitals stable.  Sx's likely viral.  Doubt ACS or PE.  Pt agrees to PMD f/u and return precautions given.    The patient appears reasonably screened and/or stabilized for discharge and I doubt any other medical condition or other [facility] requiring further screening, evaluation, or treatment in the ED at this time prior to discharge.   Final Clinical Impressions(s) / ED Diagnoses   Final diagnoses:  Bronchitis    New Prescriptions Discharge Medication List as of  03/28/2016 10:04 PM    START taking these medications   Details  guaiFENesin-codeine (ROBITUSSIN AC) 100-10 MG/5ML syrup Take 10 mLs by mouth 3 (three) times daily as needed., Starting Sat 03/28/2016, Print    predniSONE (DELTASONE) 10 MG tablet Take 6 tablets day one, 5 tablets day two, 4 tablets day three, 3 tablets day four, 2 tablets day five, then 1 tablet day six, Print         Pauline Ausammy Jaicob Dia, PA-C 03/31/16 1702    Maia PlanJoshua G Long, MD 04/01/16 [phone]

## 2016-06-03 NOTE — Congregational Nurse Program (Unsigned)
Congregational Nurse Program Note  Date of Encounter: 06/03/2016  Past Medical History: Past Medical History:  Diagnosis Date  . Back pain   . Pneumonia   . Pre-diabetes     Encounter Details:     CNP Questionnaire - 05/08/16 1122      Patient Demographics   Is this a new or existing patient? Existing   Patient is considered a/an Not Applicable   Race Caucasian/White     Patient Assistance   Patient's financial/insurance status Self-Pay (Uninsured)   Uninsured Patient ([employer], [employer]s) No   Patient referred to apply for the following financial assistance Not Applicable   Food insecurities addressed Provided food supplies   Transportation assistance No   Assistance securing medications No   Educational health offerings Hypertension;Nutrition;Diabetes     Encounter Details   Primary purpose of visit Education/Health Concerns   Was an Emergency Department visit averted? Not Applicable   Does patient have a medical provider? No   Patient referred to Not Applicable   Was a mental health screening completed? (GAINS tool) No   Does patient have dental issues? No   Does patient have vision issues? No   Does your patient have an abnormal blood pressure today? Yes   Since previous encounter, have you referred patient for abnormal blood pressure that resulted in a new diagnosis or medication change? No   Does your patient have an abnormal blood glucose today? No   Since previous encounter, have you referred patient for abnormal blood glucose that resulted in a new diagnosis or medication change? No   Was there a life-saving intervention made? No    B/P 145/90 Pulse 89.Lost job , Just wanted to talk.Says he controls Blood sugar by diet.  Refused Glucose test today.goes to Dr. As needed. Cecilie KicksLeanna Bellarose Burtt, RN [phone]

## 2016-10-12 LAB — HM DIABETES EYE EXAM

## 2016-11-16 ENCOUNTER — Emergency Department (HOSPITAL_COMMUNITY): Payer: Self-pay

## 2016-11-16 ENCOUNTER — Encounter (HOSPITAL_COMMUNITY): Payer: Self-pay

## 2016-11-16 DIAGNOSIS — M25512 Pain in left shoulder: Secondary | ICD-10-CM | POA: Insufficient documentation

## 2016-11-16 DIAGNOSIS — Y999 Unspecified external cause status: Secondary | ICD-10-CM | POA: Insufficient documentation

## 2016-11-16 DIAGNOSIS — Y9389 Activity, other specified: Secondary | ICD-10-CM | POA: Insufficient documentation

## 2016-11-16 DIAGNOSIS — Y929 Unspecified place or not applicable: Secondary | ICD-10-CM | POA: Insufficient documentation

## 2016-11-16 DIAGNOSIS — W010XXA Fall on same level from slipping, tripping and stumbling without subsequent striking against object, initial encounter: Secondary | ICD-10-CM | POA: Insufficient documentation

## 2016-11-16 NOTE — ED Triage Notes (Signed)
Pt reports he tripped and fell one hour PTA and has pain to left shoulder. He states his left hand is numb. Cap refill <3 seconds, + radial pulse, + sensation.

## 2016-11-17 ENCOUNTER — Emergency Department (HOSPITAL_COMMUNITY)
Admission: EM | Admit: 2016-11-17 | Discharge: 2016-11-17 | Disposition: A | Discharge status: Home / Self Care | Payer: Self-pay | Attending: Emergency Medicine | Admitting: Emergency Medicine

## 2016-11-17 DIAGNOSIS — M25512 Pain in left shoulder: Secondary | ICD-10-CM

## 2016-11-17 DIAGNOSIS — W19XXXA Unspecified fall, initial encounter: Secondary | ICD-10-CM

## 2016-11-17 MED ORDER — IBUPROFEN 800 MG PO TABS
800.0000 mg | ORAL_TABLET | Freq: Once | ORAL | Status: AC
  Administered 2016-11-17: 800 mg via ORAL
  Filled 2016-11-17: qty 1

## 2016-11-17 MED ORDER — HYDROCODONE-ACETAMINOPHEN 5-325 MG PO TABS: 1.0000 | ORAL_TABLET | Freq: Four times a day (QID) | ORAL | 0 refills | Status: DC | PRN

## 2016-11-17 MED ORDER — NAPROXEN 500 MG PO TABS: 500.0000 mg | ORAL_TABLET | Freq: Two times a day (BID) | ORAL | 0 refills | Status: DC

## 2016-11-17 NOTE — ED Provider Notes (Signed)
[facility]-EMERGENCY DEPT Provider Note   CSN: [id number] Arrival date & time: 11/16/16  2221     History   Chief Complaint Chief Complaint  Patient presents with  . Fall  . Shoulder Injury    HPI Brendan Stone is a 51 y.o. male with a hx of Chronic back pain presents to the Emergency Department complaining of acute, persistent left shoulder pain after mechanical fall approximately 10 hours ago. Patient reports he fell directly down onto his elbow causing pain in his shoulder. He denies any pain or swelling of his elbow. He reports since that time he's had paresthesias of the entire left arm worse from the elbow to the hand. He denies weakness. He is right-hand dominant. He denies hitting his head during the fall. He denies neck or back pain. No paresthesias to the right arm or either leg. No treatment prior to arrival. No neck avoiding alleviating factors. He denies loss of bowel or bladder control.  The history is provided by the patient and medical records. No language interpreter was used.    Past Medical History:  Diagnosis Date  . Back pain   . Pneumonia   . Pre-diabetes     There are no active problems to display for this patient.   Past Surgical History:  Procedure Laterality Date  . HAND SURGERY     rt  . REPAIR KNEE LIGAMENT     rt  . ROTATOR CUFF REPAIR     rt       Home Medications    Prior to Admission medications   Medication Sig Start Date End Date Taking? Authorizing Provider  guaiFENesin-codeine (ROBITUSSIN AC) 100-10 MG/5ML syrup Take 10 mLs by mouth 3 (three) times daily as needed. 03/28/16   Triplett, Tammy, PA-C  HYDROcodone-acetaminophen (NORCO/VICODIN) 5-325 MG tablet Take 1 tablet by mouth every 6 (six) hours as needed for severe pain. 11/17/16   Hargis Vandyne, Dahlia Client, PA-C  naproxen (NAPROSYN) 500 MG tablet Take 1 tablet (500 mg total) by mouth 2 (two) times daily with a meal. 11/17/16   Tanisa Lagace, Dahlia Client, PA-C  predniSONE (DELTASONE) 10 MG tablet  Take 6 tablets day one, 5 tablets day two, 4 tablets day three, 3 tablets day four, 2 tablets day five, then 1 tablet day six 03/28/16   Pauline Aus, PA-C    Family History Family History  Problem Relation Age of Onset  . Cancer Mother   . Heart disease Father   . Diabetes Father     Social History Social History  Substance Use Topics  . Smoking status: Never Smoker  . Smokeless tobacco: Never Used  . Alcohol use Yes     Comment: occas     Allergies   Tape   Review of Systems Review of Systems  Constitutional: Negative for chills and fever.  Gastrointestinal: Negative for nausea and vomiting.  Musculoskeletal: Positive for arthralgias and joint swelling. Negative for back pain, neck pain and neck stiffness.  Skin: Negative for wound.  Neurological: Negative for numbness and headaches.  Hematological: Does not bruise/bleed easily.  Psychiatric/Behavioral: The patient is not nervous/anxious.   All other systems reviewed and are negative.    Physical Exam Updated Vital Signs BP (!) 153/98 (BP Location: Left Arm)   Pulse 97   Temp 98.1 F (36.7 C) (Oral)   Resp 18   SpO2 97%   Physical Exam  Constitutional: He appears well-developed and well-nourished. No distress.  HENT:  Head: Normocephalic and atraumatic.  Eyes: Conjunctivae are  normal.  Neck: Normal range of motion.  Cardiovascular: Normal rate, regular rhythm and intact distal pulses.   Capillary refill < 3 sec  Pulmonary/Chest: Effort normal and breath sounds normal.  Musculoskeletal: He exhibits tenderness. He exhibits no edema.       Left shoulder: He exhibits decreased range of motion, tenderness and pain.  Left shoulder: Tenderness to palpation along the anterior portion of the shoulder, and over the brachial plexus, no palpable deformity, step-off. No lacerations or open wounds. Patient able to abduct to 90 and full adduction. Left elbow: Full range of motion without pain. No tenderness to  palpation of the proximal ulna, radial head or olecranon process. No open wounds. Left wrist: Full range of motion, no tenderness to palpation along the joint lines Left hand: No tenderness to palpation, open wounds, step-off or deformity. Full range of motion of all fingers.  Neurological: He is alert. Coordination normal.  Strength in the left elbow, wrist and hand is 5/5 with flexion, extension and grip strength. Sensation is intact to the entire left upper extremity to dull and sharp in both the radial and ulnar distributions. Sensation to the hand isn't back to dull and sharp in the radian, median and ulnar distributions. Patient has intact 2 point discrimination in all fingers of the left hand.   Skin: Skin is warm and dry. He is not diaphoretic.  No tenting of the skin   Psychiatric: He has a normal mood and affect.  Nursing note and vitals reviewed.    ED Treatments / Results   Radiology Dg Shoulder Left  Result Date: 11/16/2016 CLINICAL DATA:  Left shoulder pain after slip and fall injury tonight. EXAM: LEFT SHOULDER - 2+ VIEW COMPARISON:  None. FINDINGS: There is no evidence of fracture or dislocation. There is no evidence of arthropathy or other focal bone abnormality. Soft tissues are unremarkable. IMPRESSION: Negative. Electronically Signed   By: Ellery Plunkaniel R Mitchell M.D.   On: 11/16/2016 23:03    Procedures Procedures (including critical care time)  Medications Ordered in ED Medications  ibuprofen (ADVIL,MOTRIN) tablet 800 mg (800 mg Oral Given 11/17/16 0305)     Initial Impression / Assessment and Plan / ED Course  I have reviewed the triage vital signs and the nursing notes.  Pertinent labs & imaging results that were available during my care of the patient were reviewed by me and considered in my medical decision making (see chart for details).     Patient presents with left shoulder pain after fall. X-ray without evidence of dislocation, subluxation or fracture.  He reports paresthesias and "numbness" to the left upper extremity however on exam patient with intact sensation to dull, sharp and 2 points phonation. He has no weakness in the left upper extremity. Patient given ibuprofen and sling. He is already established with Dr. Thurston HoleWainer and will follow this week. Patient given short course of Vicodin for severe pain at night. Discussed reasons to return immediately to the emergency department including loss of sensation, Feldman weakness or other concerns. Patient is understanding and is in agreement with the plan.  Final Clinical Impressions(s) / ED Diagnoses   Final diagnoses:  Fall, initial encounter  Acute pain of left shoulder    New Prescriptions New Prescriptions   HYDROCODONE-ACETAMINOPHEN (NORCO/VICODIN) 5-325 MG TABLET    Take 1 tablet by mouth every 6 (six) hours as needed for severe pain.   NAPROXEN (NAPROSYN) 500 MG TABLET    Take 1 tablet (500 mg total) by mouth 2 (  two) times daily with a meal.     Warner Laduca, Boyd Kerbs 11/17/16 0330    Wilkie Aye Mayer Masker, MD 11/17/16 [phone]

## 2016-11-17 NOTE — Discharge Instructions (Signed)
1. Medications:naprosyn, vicodin only for severe breakthrough pain, usual home medications 2. Treatment: rest, ice, elevate and use brace, drink plenty of fluids, gentle stretching 3. Follow Up: Please followup with orthopedics as directed or your PCP in 1 week if no improvement for discussion of your diagnoses and further evaluation after today's visit; if you do not have a primary care doctor use the resource guide provided to find one; Please return to the ER for worsening symptoms or other concerns

## 2016-11-17 NOTE — Progress Notes (Signed)
Orthopedic Tech Progress Note Patient Details:  [facility]rendan [facility]alWilson L [facility] 06/30/1965 [id number]  Ortho Devices Type of Ortho Device: Arm sling Ortho Device/Splint Location: lue Ortho Device/Splint Interventions: Ordered, Application, Adjustment   Trinna PostMartinez, Karter Hellmer J 11/17/2016, 3:18 AM

## 2017-09-15 ENCOUNTER — Other Ambulatory Visit: Payer: Self-pay

## 2017-09-15 ENCOUNTER — Emergency Department (HOSPITAL_COMMUNITY)
Admission: EM | Admit: 2017-09-15 | Discharge: 2017-09-15 | Disposition: A | Discharge status: Home / Self Care | Payer: Self-pay | Attending: Emergency Medicine | Admitting: Emergency Medicine

## 2017-09-15 ENCOUNTER — Encounter (HOSPITAL_COMMUNITY): Payer: Self-pay | Admitting: Emergency Medicine

## 2017-09-15 DIAGNOSIS — Z79899 Other long term (current) drug therapy: Secondary | ICD-10-CM | POA: Insufficient documentation

## 2017-09-15 DIAGNOSIS — Z7984 Long term (current) use of oral hypoglycemic drugs: Secondary | ICD-10-CM | POA: Insufficient documentation

## 2017-09-15 DIAGNOSIS — E1165 Type 2 diabetes mellitus with hyperglycemia: Secondary | ICD-10-CM | POA: Insufficient documentation

## 2017-09-15 DIAGNOSIS — R739 Hyperglycemia, unspecified: Secondary | ICD-10-CM

## 2017-09-15 HISTORY — DX: Type 2 diabetes mellitus without complications: E11.9

## 2017-09-15 LAB — URINALYSIS, ROUTINE W REFLEX MICROSCOPIC
BACTERIA UA: NONE SEEN
Bilirubin Urine: NEGATIVE
Glucose, UA: >=500 mg/dL — AB
Hgb urine dipstick: NEGATIVE
KETONES UR: 5 mg/dL — AB
Leukocytes, UA: NEGATIVE
Nitrite: NEGATIVE
PH: 5 (ref 5.0–8.0)
Protein, ur: NEGATIVE mg/dL
SPECIFIC GRAVITY, URINE: 1.027 (ref 1.005–1.030)

## 2017-09-15 LAB — COMPREHENSIVE METABOLIC PANEL
ALBUMIN: 4.3 g/dL (ref 3.5–5.0)
ALT: 32 U/L (ref 17–63)
ANION GAP: 11 (ref 5–15)
AST: 21 U/L (ref 15–41)
Alkaline Phosphatase: 67 U/L (ref 38–126)
BUN: 16 mg/dL (ref 6–20)
CO2: 23 mmol/L (ref 22–32)
Calcium: 9.2 mg/dL (ref 8.9–10.3)
Chloride: 101 mmol/L (ref 101–111)
Creatinine, Ser: 0.78 mg/dL (ref 0.61–1.24)
GFR calc Af Amer: >60 mL/min (ref >60)
GFR calc non Af Amer: >60 mL/min (ref >60)
GLUCOSE: 230 mg/dL — AB (ref 65–99)
Potassium: 4.1 mmol/L (ref 3.5–5.1)
SODIUM: 135 mmol/L (ref 135–145)
Total Bilirubin: 0.6 mg/dL (ref 0.3–1.2)
Total Protein: 7.4 g/dL (ref 6.5–8.1)

## 2017-09-15 LAB — CBC
HCT: 43.4 % (ref 39.0–52.0)
HEMOGLOBIN: 15 g/dL (ref 13.0–17.0)
MCH: 29.1 pg (ref 26.0–34.0)
MCHC: 34.6 g/dL (ref 30.0–36.0)
MCV: 84.1 fL (ref 78.0–100.0)
Platelets: 190 10*3/uL (ref 150–400)
RBC: 5.16 MIL/uL (ref 4.22–5.81)
RDW: 12.8 % (ref 11.5–15.5)
WBC: 7.6 10*3/uL (ref 4.0–10.5)

## 2017-09-15 LAB — LIPASE, BLOOD: Lipase: 34 U/L (ref 11–51)

## 2017-09-15 LAB — CBG MONITORING, ED: GLUCOSE-CAPILLARY: 277 mg/dL — AB (ref 65–99)

## 2017-09-15 MED ORDER — SODIUM CHLORIDE 0.9 % IV BOLUS
1000.0000 mL | Freq: Once | INTRAVENOUS | Status: AC
  Administered 2017-09-15: 1000 mL via INTRAVENOUS

## 2017-09-15 NOTE — Discharge Instructions (Signed)
Your glucose was elevated today at 230.  Try to lose weight.  Recommend a log of your glucose numbers so a doctor can make a judgment about your medication.

## 2017-09-15 NOTE — ED Triage Notes (Signed)
Pt c/o of feeling nauseated, clammy and dizzy starting at 1300.  CBG 277.

## 2017-09-15 NOTE — ED Provider Notes (Signed)
[facility] EMERGENCY DEPARTMENT Provider Note   CSN: [id number] Arrival date & time: 09/15/17  1623     History   Chief Complaint Chief Complaint  Patient presents with  . Nausea    HPI RUSH SALCE is a 52 y.o. male.  Patient complains of general complaint not feeling well, specifically sleepiness, nausea, clamminess, dizziness at 1300 today.  He is a diabetic and has been taking his medication.  Otherwise he is able to do his normal ADLs.  No substernal chest pain, dyspnea, diaphoresis, fever, chills.  He is ambulatory.  Severity of symptoms is mild to moderate.  Nothing makes symptoms better or worse.     Past Medical History:  Diagnosis Date  . Back pain   . Diabetes mellitus without complication (HCC)   . Pneumonia   . Pre-diabetes     There are no active problems to display for this patient.   Past Surgical History:  Procedure Laterality Date  . HAND SURGERY     rt  . REPAIR KNEE LIGAMENT     rt  . ROTATOR CUFF REPAIR     rt        Home Medications    Prior to Admission medications   Medication Sig Start Date End Date Taking? Authorizing Provider  glimepiride (AMARYL) 2 MG tablet Take 2 mg by mouth 2 (two) times daily.  08/26/17  Yes [provider]  metFORMIN (GLUCOPHAGE) 500 MG tablet Take 500 mg by mouth 2 (two) times daily. 01/27/17  Yes [provider]    Family History Family History  Problem Relation Age of Onset  . Cancer Mother   . Heart disease Father   . Diabetes Father     Social History Social History   Tobacco Use  . Smoking status: Never Smoker  . Smokeless tobacco: Never Used  Substance Use Topics  . Alcohol use: Yes    Comment: occas  . Drug use: No     Allergies   Tape   Review of Systems Review of Systems  All other systems reviewed and are negative.    Physical Exam Updated Vital Signs BP (!) 151/99 (BP Location: Left Arm)   Pulse 78   Temp 98.4 F (36.9 C) (Oral)   Resp 18   Ht 5'  10" (1.778 m)   Wt 95.3 kg (210 lb)   SpO2 97%   BMI 30.13 kg/m   Physical Exam  Constitutional: He is oriented to person, place, and time. He appears well-developed and well-nourished.  nad  HENT:  Head: Normocephalic and atraumatic.  Eyes: Conjunctivae are normal.  Neck: Neck supple.  Cardiovascular: Normal rate and regular rhythm.  Pulmonary/Chest: Effort normal and breath sounds normal.  Abdominal: Soft. Bowel sounds are normal.  Musculoskeletal: Normal range of motion.  Neurological: He is alert and oriented to person, place, and time.  Skin: Skin is warm and dry.  Psychiatric: He has a normal mood and affect. His behavior is normal.  Nursing note and vitals reviewed.    ED Treatments / Results  Labs (all labs ordered are listed, but only abnormal results are displayed) Labs Reviewed  COMPREHENSIVE METABOLIC PANEL - Abnormal; Notable for the following components:      Result Value   Glucose, Bld 230 (*)    All other components within normal limits  URINALYSIS, ROUTINE W REFLEX MICROSCOPIC - Abnormal; Notable for the following components:   Glucose, UA >=500 (*)    Ketones, ur 5 (*)  All other components within normal limits  CBG MONITORING, ED - Abnormal; Notable for the following components:   Glucose-Capillary 277 (*)    All other components within normal limits  LIPASE, BLOOD  CBC    EKG None  Radiology No results found.  Procedures Procedures (including critical care time)  Medications Ordered in ED Medications  sodium chloride 0.9 % bolus 1,000 mL (0 mLs Intravenous Stopped 09/15/17 1934)     Initial Impression / Assessment and Plan / ED Course  I have reviewed the triage vital signs and the nursing notes.  Pertinent labs & imaging results that were available during my care of the patient were reviewed by me and considered in my medical decision making (see chart for details).     Patient is alert x3 without any neurological deficits.   Normal color.  His work-up including labs, urinalysis were acceptable with the exception of a glucose of 230.  He was encouraged to lose weight and start a glucose log.  Final Clinical Impressions(s) / ED Diagnoses   Final diagnoses:  Hyperglycemia    ED Discharge Orders    None       Donnetta Hutchingook, Ria Redcay, MD 09/15/17 2038

## 2017-11-28 IMAGING — DX DG CHEST 2V
2 series · 2 of 2 positions shown · non-contrast
Comparison: 05/24/2012

CLINICAL DATA: Nonproductive cough for 10 days.

EXAM:
CHEST  2 VIEW

[chest pa]
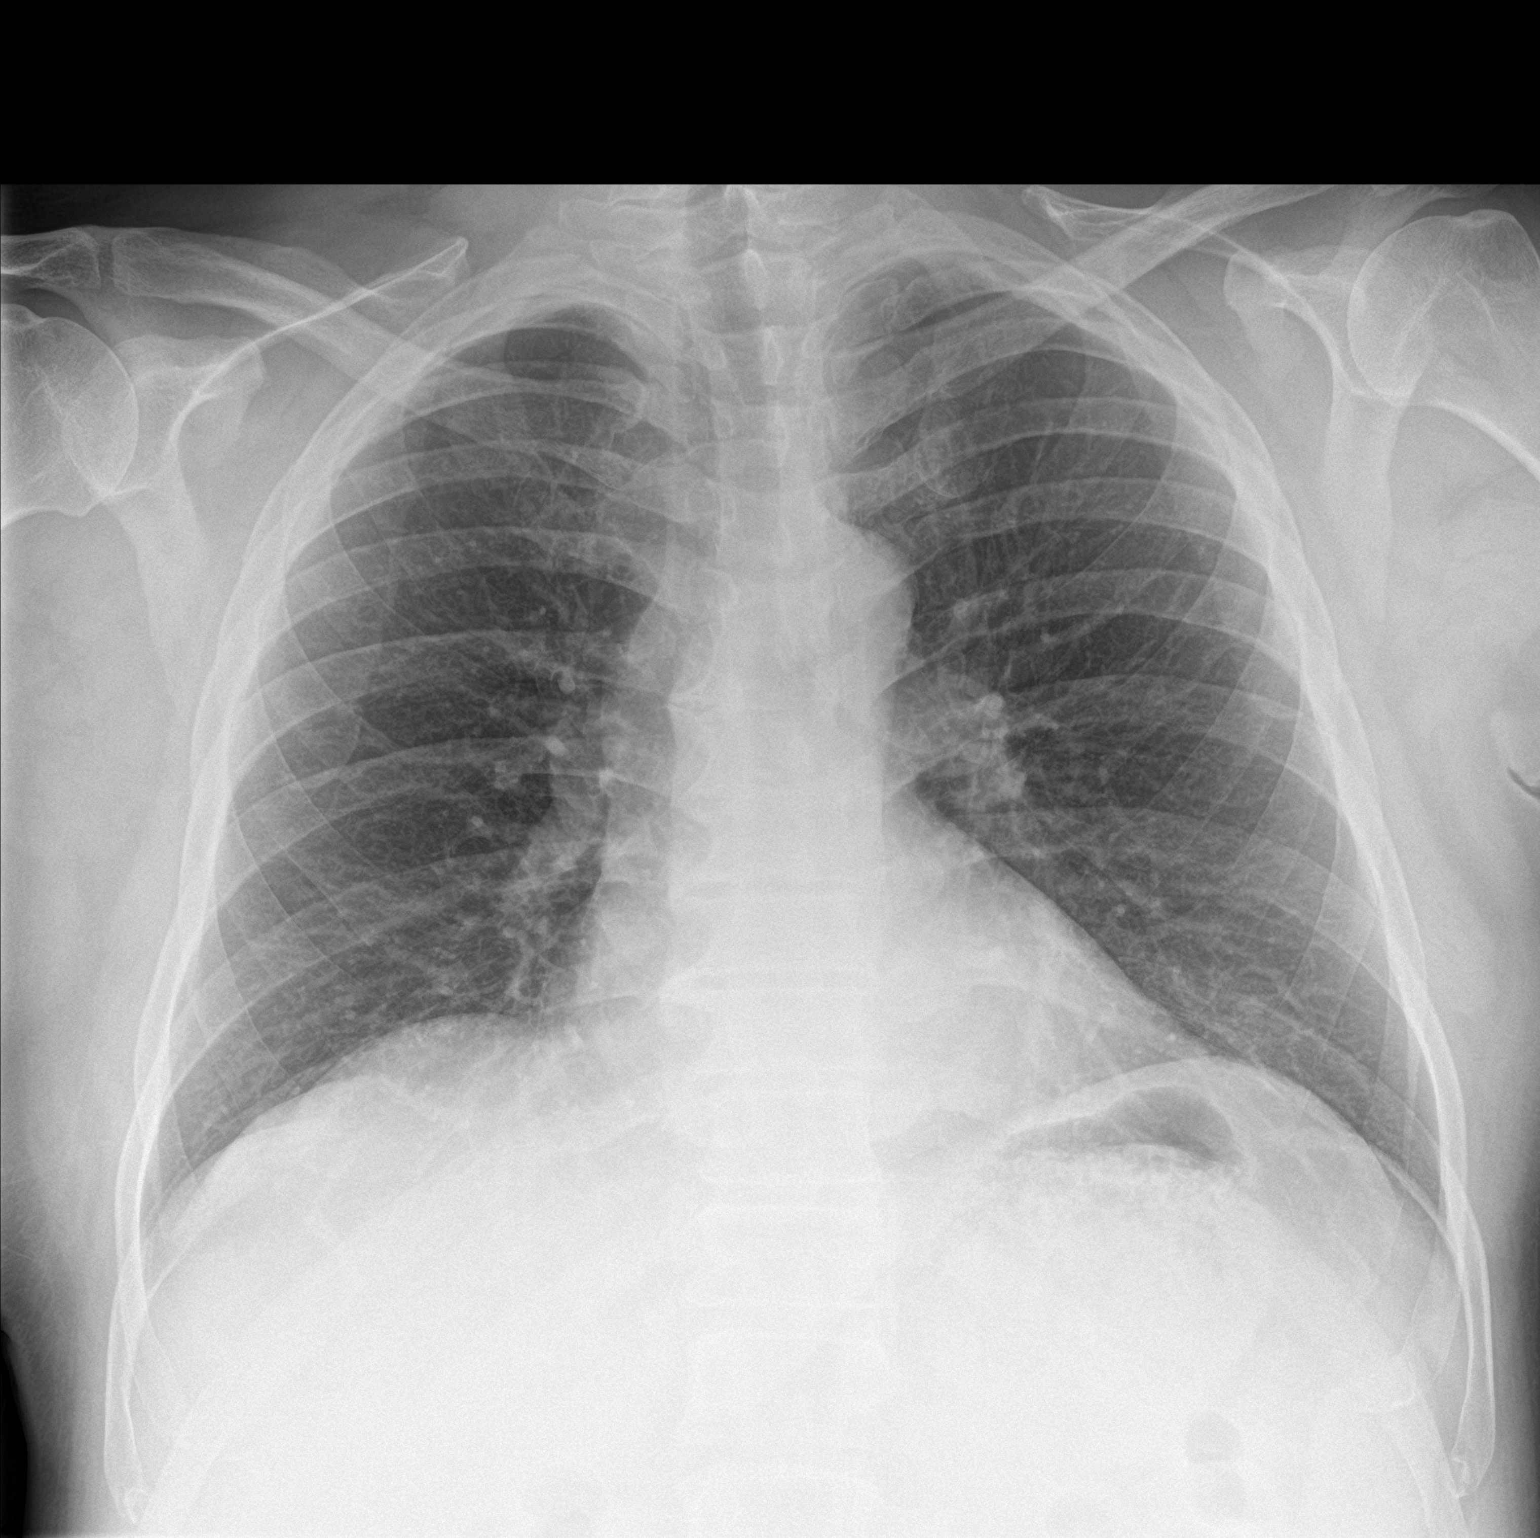

[chest lat]
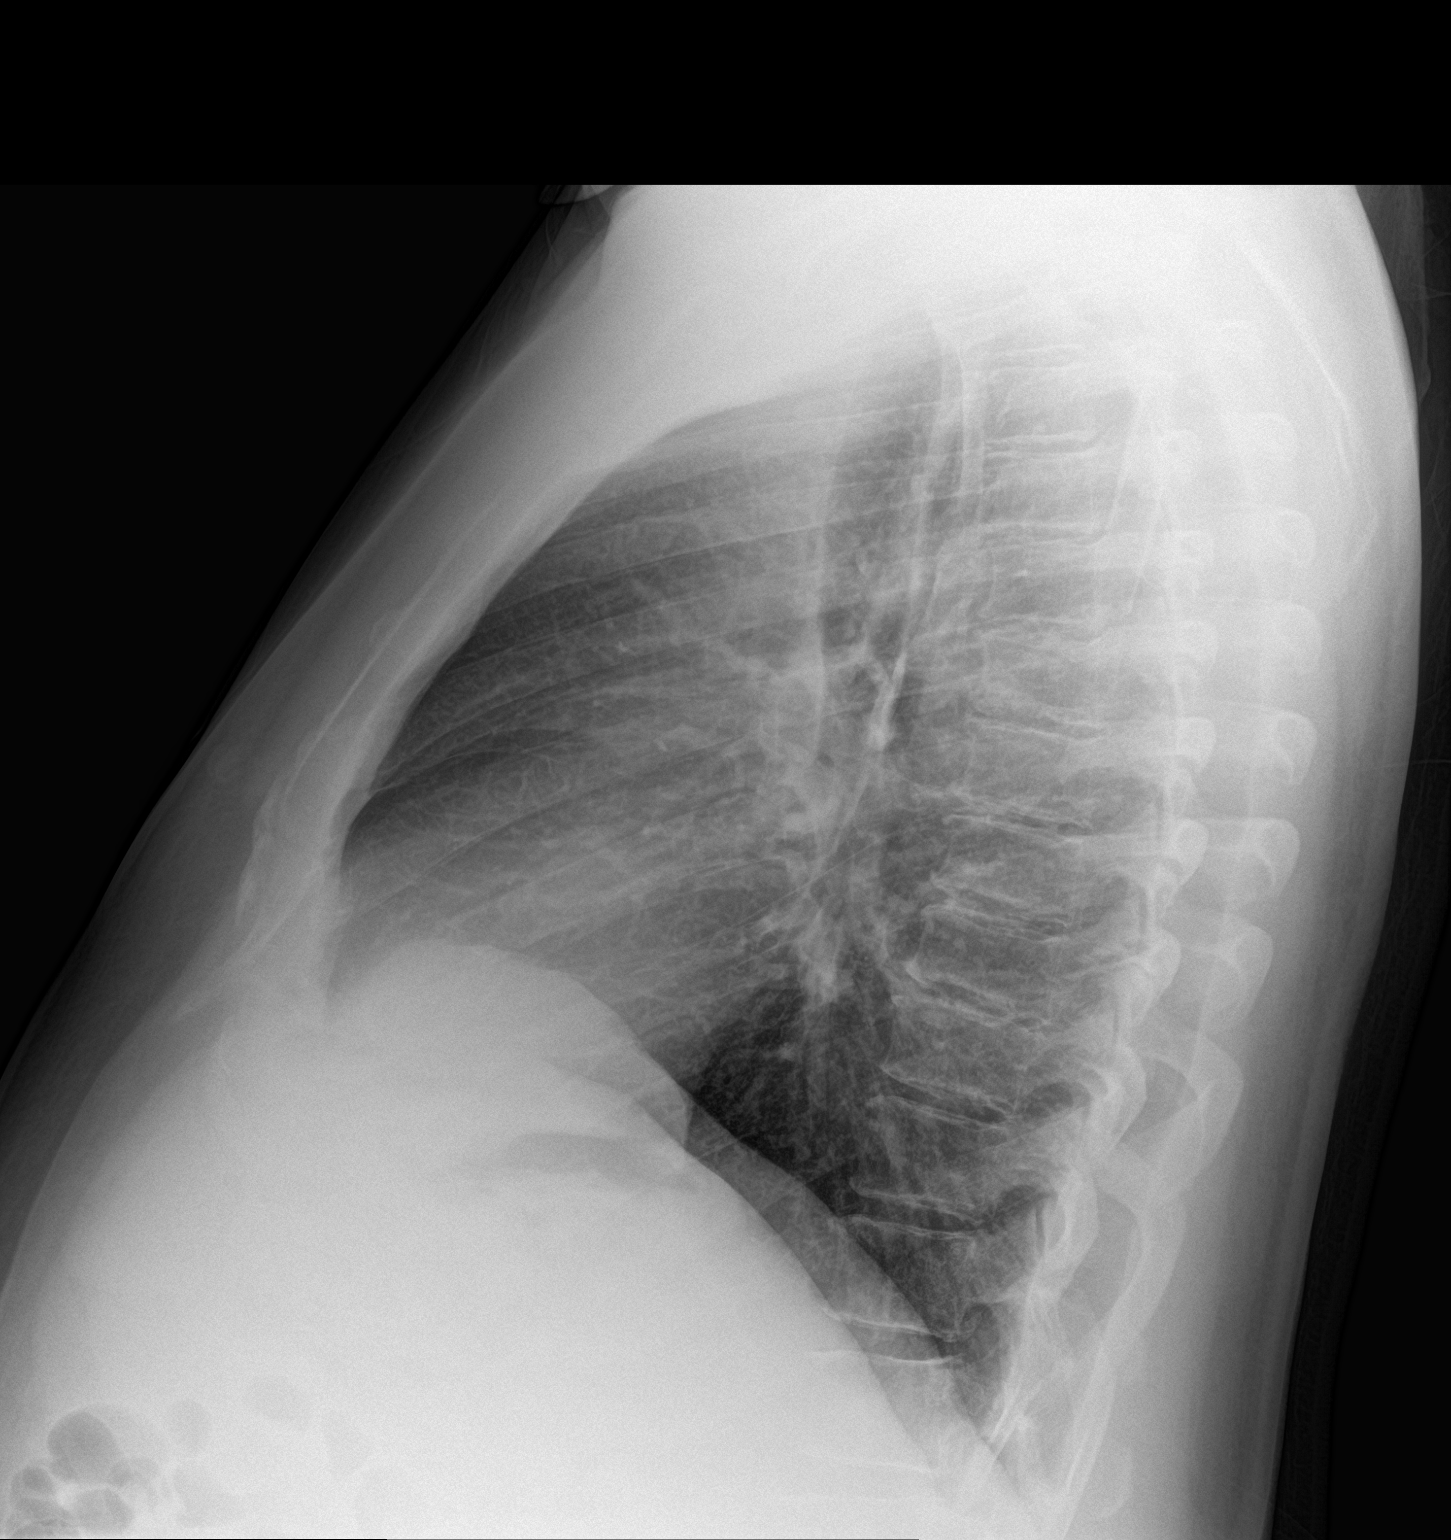

[2 of 2 positions shown; findings below may reference images not displayed]

FINDINGS: The heart size and mediastinal contours are within normal limits.
Both lungs are clear. The visualized skeletal structures are
unremarkable.
IMPRESSION: No active cardiopulmonary disease.

## 2018-05-03 ENCOUNTER — Emergency Department (HOSPITAL_COMMUNITY): Payer: Self-pay

## 2018-05-03 ENCOUNTER — Emergency Department (HOSPITAL_COMMUNITY)
Admission: EM | Admit: 2018-05-03 | Discharge: 2018-05-03 | Disposition: A | Discharge status: Home / Self Care | Payer: Self-pay | Attending: Emergency Medicine | Admitting: Emergency Medicine

## 2018-05-03 ENCOUNTER — Other Ambulatory Visit: Payer: Self-pay

## 2018-05-03 ENCOUNTER — Encounter (HOSPITAL_COMMUNITY): Payer: Self-pay | Admitting: *Deleted

## 2018-05-03 DIAGNOSIS — Y999 Unspecified external cause status: Secondary | ICD-10-CM | POA: Insufficient documentation

## 2018-05-03 DIAGNOSIS — S161XXA Strain of muscle, fascia and tendon at neck level, initial encounter: Secondary | ICD-10-CM | POA: Insufficient documentation

## 2018-05-03 DIAGNOSIS — Y939 Activity, unspecified: Secondary | ICD-10-CM | POA: Insufficient documentation

## 2018-05-03 DIAGNOSIS — Z7984 Long term (current) use of oral hypoglycemic drugs: Secondary | ICD-10-CM | POA: Insufficient documentation

## 2018-05-03 DIAGNOSIS — X58XXXA Exposure to other specified factors, initial encounter: Secondary | ICD-10-CM | POA: Insufficient documentation

## 2018-05-03 DIAGNOSIS — Y929 Unspecified place or not applicable: Secondary | ICD-10-CM | POA: Insufficient documentation

## 2018-05-03 DIAGNOSIS — E119 Type 2 diabetes mellitus without complications: Secondary | ICD-10-CM | POA: Insufficient documentation

## 2018-05-03 MED ORDER — HYDROCODONE-ACETAMINOPHEN 5-325 MG PO TABS: 1.0000 | ORAL_TABLET | ORAL | 0 refills | Status: DC | PRN

## 2018-05-03 MED ORDER — HYDROCODONE-ACETAMINOPHEN 5-325 MG PO TABS
1.0000 | ORAL_TABLET | Freq: Once | ORAL | Status: AC
  Administered 2018-05-03: 1 via ORAL
  Filled 2018-05-03: qty 1

## 2018-05-03 MED ORDER — NAPROXEN 500 MG PO TABS: 500.0000 mg | ORAL_TABLET | Freq: Two times a day (BID) | ORAL | 0 refills | Status: DC

## 2018-05-03 MED ORDER — CYCLOBENZAPRINE HCL 5 MG PO TABS: 5.0000 mg | ORAL_TABLET | Freq: Three times a day (TID) | ORAL | 0 refills | Status: DC | PRN

## 2018-05-03 MED ORDER — METHOCARBAMOL 500 MG PO TABS
750.0000 mg | ORAL_TABLET | Freq: Once | ORAL | Status: AC
  Administered 2018-05-03: 750 mg via ORAL
  Filled 2018-05-03: qty 2

## 2018-05-03 NOTE — ED Triage Notes (Signed)
Pt c/o neck pain that started yesterday and pt reports that he heard a "pop" in neck area today,
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
## 2018-05-03 NOTE — Discharge Instructions (Signed)
I suspect your symptoms are from acute muscle spasm in your neck as discussed.  Use the medicines prescribed, using caution not to drive within 4 hours of taking hydrocodone or flexeril as these will make you drowsy.  Apply heat for 20 minutes several times daily followed by gentle range of motion exercises.

## 2018-07-19 IMAGING — CR DG SHOULDER 2+V*L*
3 series · 3 of 3 positions shown · non-contrast
Comparison: None.

CLINICAL DATA: Left shoulder pain after slip and fall injury
tonight.

EXAM:
LEFT SHOULDER - 2+ VIEW

[shoulder grashey]
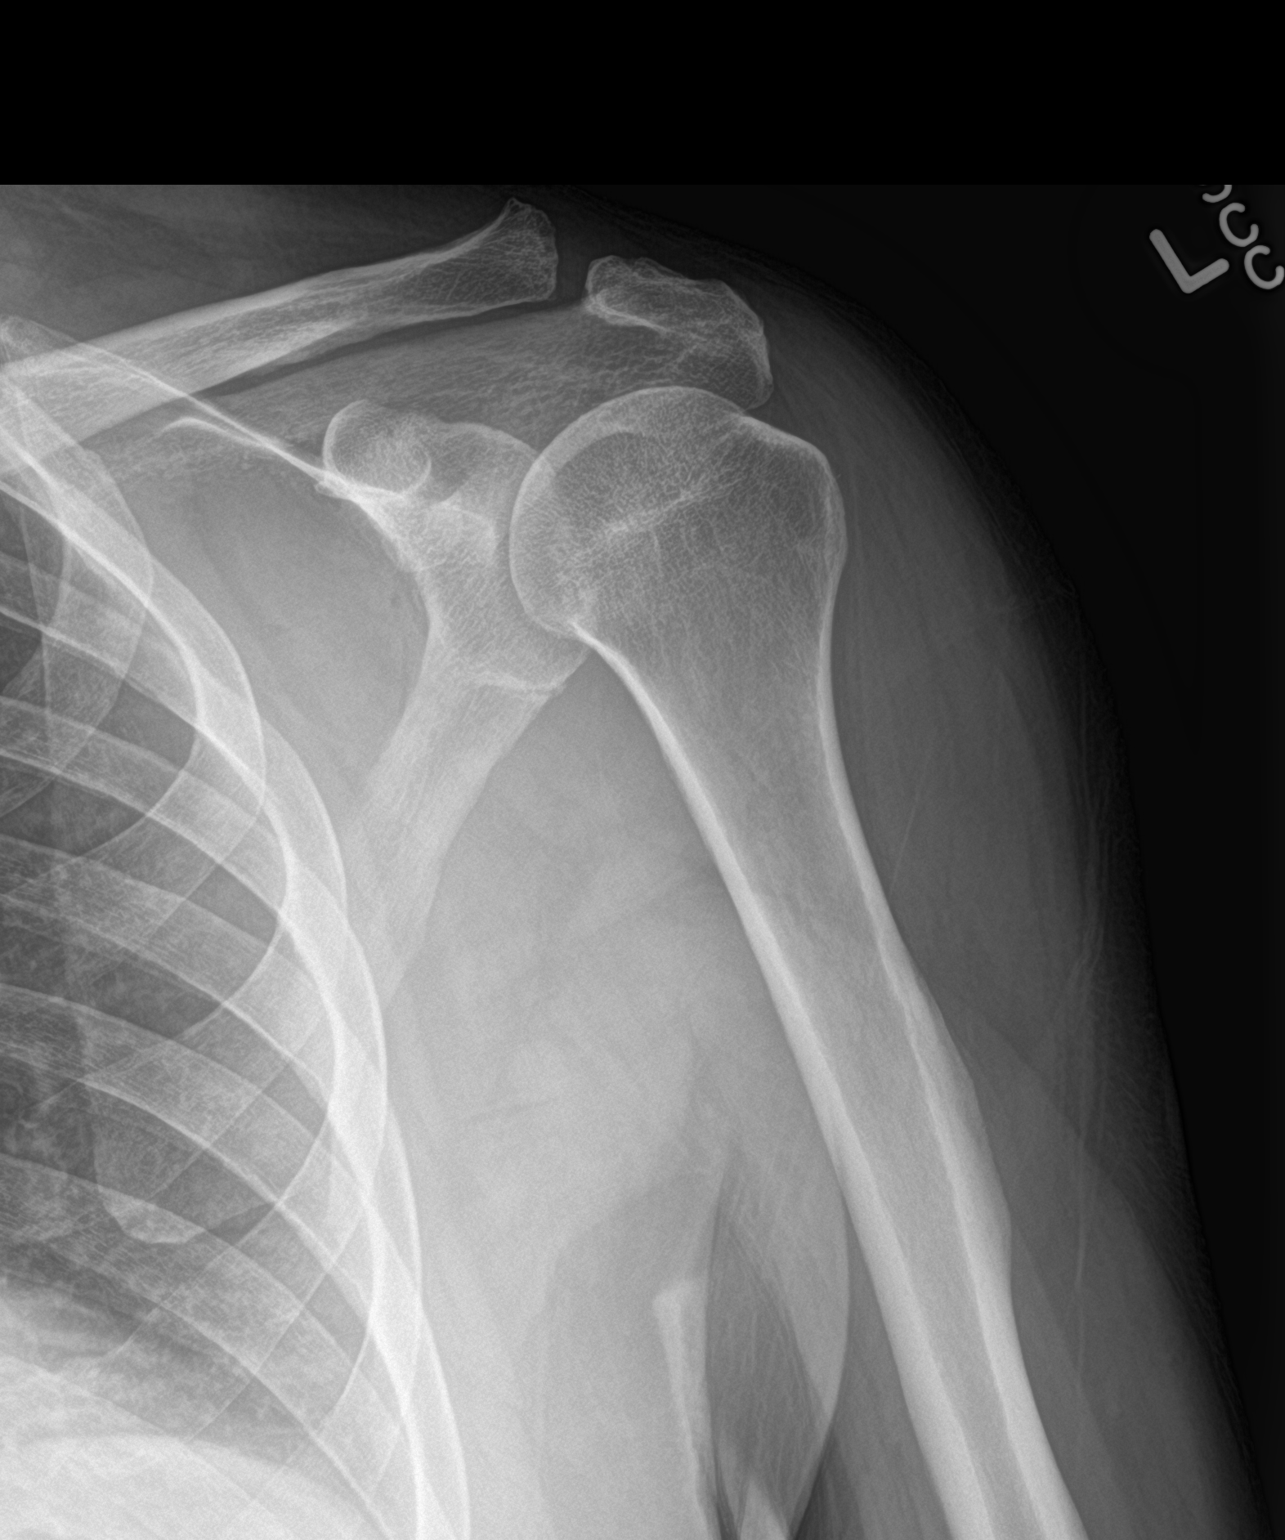

[shoulder y view]
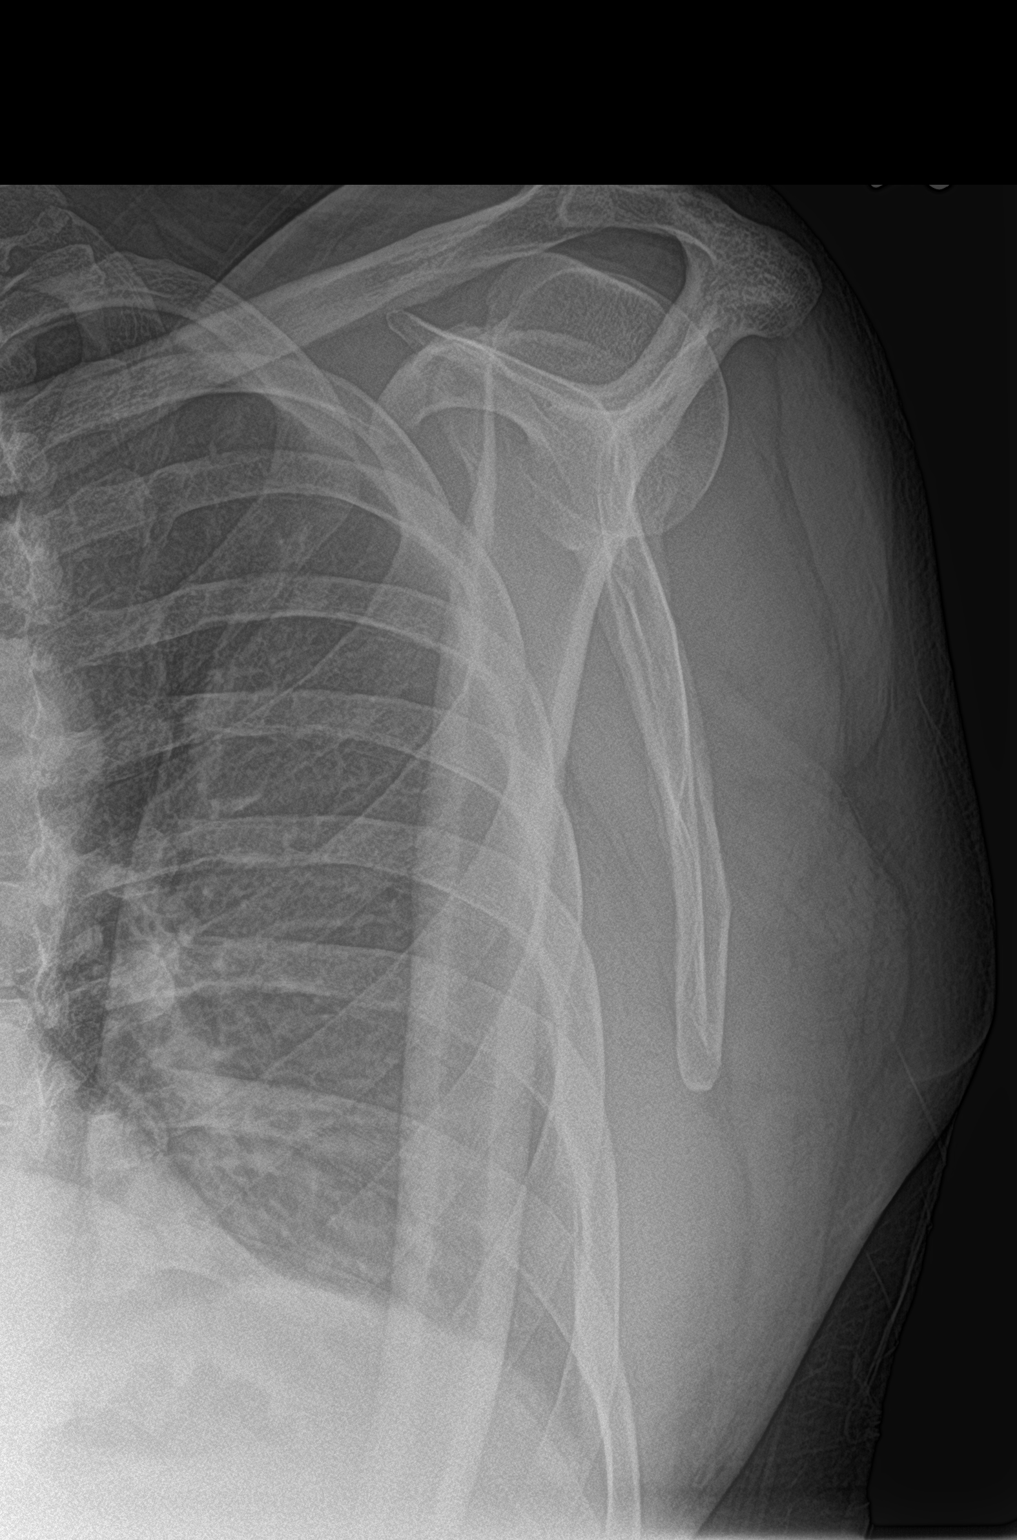

[shoulder axillary]
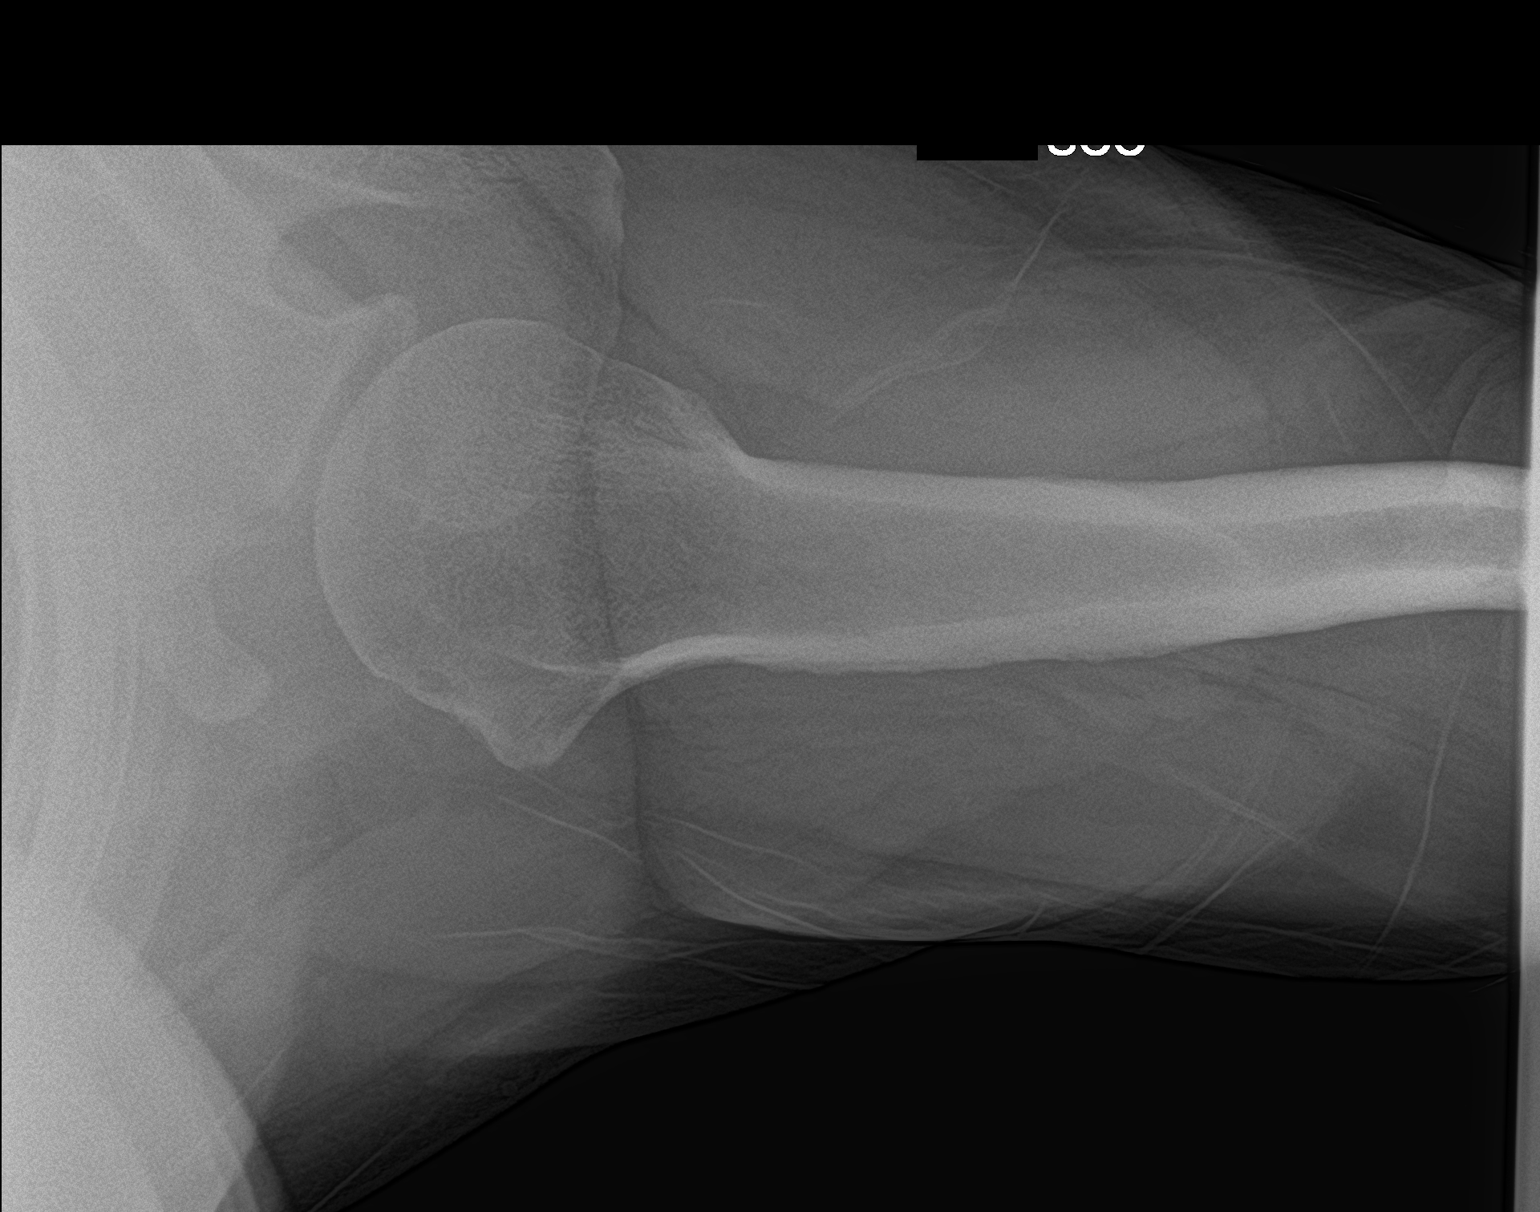

[3 of 3 positions shown; findings below may reference images not displayed]

FINDINGS: There is no evidence of fracture or dislocation. There is no
evidence of arthropathy or other focal bone abnormality. Soft
tissues are unremarkable.
IMPRESSION: Negative.

## 2018-10-04 DIAGNOSIS — T63301A Toxic effect of unspecified spider venom, accidental (unintentional), initial encounter: Secondary | ICD-10-CM | POA: Diagnosis not present

## 2018-10-04 DIAGNOSIS — Z6832 Body mass index (BMI) 32.0-32.9, adult: Secondary | ICD-10-CM | POA: Diagnosis not present

## 2018-10-04 DIAGNOSIS — L03311 Cellulitis of abdominal wall: Secondary | ICD-10-CM | POA: Diagnosis not present

## 2018-10-06 DIAGNOSIS — Z6832 Body mass index (BMI) 32.0-32.9, adult: Secondary | ICD-10-CM | POA: Diagnosis not present

## 2018-10-06 DIAGNOSIS — L02211 Cutaneous abscess of abdominal wall: Secondary | ICD-10-CM | POA: Diagnosis not present

## 2018-10-10 DIAGNOSIS — Z6832 Body mass index (BMI) 32.0-32.9, adult: Secondary | ICD-10-CM | POA: Diagnosis not present

## 2018-10-10 DIAGNOSIS — L0291 Cutaneous abscess, unspecified: Secondary | ICD-10-CM | POA: Diagnosis not present

## 2018-11-01 ENCOUNTER — Emergency Department (HOSPITAL_COMMUNITY): Payer: BC Managed Care – PPO

## 2018-11-01 ENCOUNTER — Other Ambulatory Visit: Payer: Self-pay

## 2018-11-01 ENCOUNTER — Encounter (HOSPITAL_COMMUNITY): Payer: Self-pay

## 2018-11-01 ENCOUNTER — Emergency Department (HOSPITAL_COMMUNITY)
Admission: EM | Admit: 2018-11-01 | Discharge: 2018-11-01 | Disposition: A | Discharge status: Home / Self Care | Payer: BC Managed Care – PPO | Attending: Emergency Medicine | Admitting: Emergency Medicine

## 2018-11-01 DIAGNOSIS — Z7984 Long term (current) use of oral hypoglycemic drugs: Secondary | ICD-10-CM | POA: Insufficient documentation

## 2018-11-01 DIAGNOSIS — Z79899 Other long term (current) drug therapy: Secondary | ICD-10-CM | POA: Insufficient documentation

## 2018-11-01 DIAGNOSIS — E119 Type 2 diabetes mellitus without complications: Secondary | ICD-10-CM | POA: Diagnosis not present

## 2018-11-01 DIAGNOSIS — R059 Cough, unspecified: Secondary | ICD-10-CM

## 2018-11-01 DIAGNOSIS — R05 Cough: Secondary | ICD-10-CM | POA: Diagnosis not present

## 2018-11-01 MED ORDER — BENZONATATE 100 MG PO CAPS
100.0000 mg | ORAL_CAPSULE | Freq: Three times a day (TID) | ORAL | 0 refills | Status: DC
Start: 2018-11-01 — End: 2019-03-10

## 2018-11-01 MED ORDER — BENZONATATE 100 MG PO CAPS
100.0000 mg | ORAL_CAPSULE | Freq: Once | ORAL | Status: AC
  Administered 2018-11-01: 100 mg via ORAL
  Filled 2018-11-01: qty 1

## 2018-11-01 NOTE — ED Notes (Signed)
Pt to xr 

## 2018-11-01 NOTE — ED Provider Notes (Signed)
[facility]Y [facility]PARTMENT Provider Note   CSN: [id number] Arrival date & time: 11/01/18  0550    History   Chief Complaint Chief Complaint  Patient presents with  . Cough    HPI Brendan Stone is a 53 y.o. male.     HPI  This is a 53 year old male with history of diabetes, back pain who presents with cough.  Patient reports he developed a nonproductive cough on Friday.  He states that he will cough in fits and then have 5 or 10 minutes of relief before he starts coughing again.  He denies any chest pain or shortness of breath.  No fevers or known sick contacts.  He otherwise states "I feel like a million bucks."  He reports that his family has done well self isolating.  No known COVID contacts.  His employer wanted him to be checked out.  Denies history of smoking or COPD.  Past Medical History:  Diagnosis Date  . Back pain   . Diabetes mellitus without complication (HCC)   . Pneumonia   . Pre-diabetes     There are no active problems to display for this patient.   Past Surgical History:  Procedure Laterality Date  . HAND SURGERY     rt  . REPAIR KNEE LIGAMENT     rt  . ROTATOR CUFF REPAIR     rt        Home Medications    Prior to Admission medications   Medication Sig Start Date End Date Taking? Authorizing Provider  cyclobenzaprine (FLEXERIL) 5 MG tablet Take 1 tablet (5 mg total) by mouth 3 (three) times daily as needed for muscle spasms. 05/03/18   Burgess AmorIdol, Julie, PA-C  glimepiride (AMARYL) 2 MG tablet Take 2 mg by mouth 2 (two) times daily.  08/26/17   [provider]  HYDROcodone-acetaminophen (NORCO/VICODIN) 5-325 MG tablet Take 1 tablet by mouth every 4 (four) hours as needed. 05/03/18   Burgess AmorIdol, Julie, PA-C  metFORMIN (GLUCOPHAGE) 500 MG tablet Take 500 mg by mouth 2 (two) times daily. 01/27/17   [provider]  naproxen (NAPROSYN) 500 MG tablet Take 1 tablet (500 mg total) by mouth 2 (two) times daily. 05/03/18   Burgess AmorIdol, Julie, PA-C     Family History Family History  Problem Relation Age of Onset  . Cancer Mother   . Heart disease Father   . Diabetes Father     Social History Social History   Tobacco Use  . Smoking status: Never Smoker  . Smokeless tobacco: Never Used  Substance Use Topics  . Alcohol use: Yes    Comment: occas  . Drug use: No     Allergies   Tape   Review of Systems Review of Systems  Constitutional: Negative for fever.  HENT: Negative for sore throat.   Respiratory: Positive for cough. Negative for shortness of breath.   Cardiovascular: Negative for chest pain.  Gastrointestinal: Negative for abdominal pain, nausea and vomiting.  All other systems reviewed and are negative.    Physical Exam Updated Vital Signs BP (!) 154/90   Pulse 94   Temp 97.8 F (36.6 C) (Oral)   Resp 18   Ht 1.727 m (5\' 8" )   Wt 90.7 kg   SpO2 98%   BMI 30.41 kg/m   Physical Exam Vitals signs and nursing note reviewed.  Constitutional:      Appearance: He is well-developed. He is not ill-appearing.     Comments: Overweight  HENT:  Head: Normocephalic and atraumatic.     Mouth/Throat:     Mouth: Mucous membranes are moist.  Neck:     Musculoskeletal: Neck supple.  Cardiovascular:     Rate and Rhythm: Normal rate and regular rhythm.     Heart sounds: Normal heart sounds. No murmur.  Pulmonary:     Effort: Pulmonary effort is normal. No respiratory distress.     Breath sounds: Normal breath sounds. No wheezing.  Abdominal:     Palpations: Abdomen is soft.     Tenderness: There is no abdominal tenderness. There is no rebound.  Musculoskeletal:     Right lower leg: No edema.     Left lower leg: No edema.  Skin:    General: Skin is warm and dry.  Neurological:     Mental Status: He is alert and oriented to person, place, and time.  Psychiatric:        Mood and Affect: Mood normal.      ED Treatments / Results  Labs (all labs ordered are listed, but only abnormal results are  displayed) Labs Reviewed - No data to display  EKG None  Radiology Dg Chest 2 View  Result Date: 11/01/2018 CLINICAL DATA:  Productive cough EXAM: CHEST - 2 VIEW COMPARISON:  03/28/2016 FINDINGS: The heart size and mediastinal contours are within normal limits. Both lungs are clear. The visualized skeletal structures are unremarkable. IMPRESSION: No active cardiopulmonary disease. Electronically Signed   By: Inez Catalina M.D.   On: 11/01/2018 07:36    Procedures Procedures (including critical care time)  Medications Ordered in ED Medications  benzonatate (TESSALON) capsule 100 mg (has no administration in time range)     Initial Impression / Assessment and Plan / ED Course  I have reviewed the triage vital signs and the nursing notes.  Pertinent labs & imaging results that were available during my care of the patient were reviewed by me and considered in my medical decision making (see chart for details).        Patient presents with cough.  Otherwise denies any other significant symptoms.  He is overall nontoxic-appearing and vital signs are reassuring including O2 sats of 100%.  He is afebrile.  His pulmonary exam is benign.  X-ray was obtained just to rule out pneumonia.  He has not had any COVID attacks.  Patient was given Ladona Ridgel.  X-ray reviewed by myself and shows no evidence of cardiopulmonary disease.  Reassured.  Will discharge with Tessalon Perles.  After history, exam, and medical workup I feel the patient has been appropriately medically screened and is safe for discharge home. Pertinent diagnoses were discussed with the patient. Patient was given return precautions.   Final Clinical Impressions(s) / ED Diagnoses   Final diagnoses:  Cough    ED Discharge Orders    None       Merryl Hacker, MD 11/01/18 2259

## 2018-11-01 NOTE — ED Triage Notes (Signed)
Pt reports cough over the weekend. Pt denies any other symptoms. 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

## 2018-11-03 DIAGNOSIS — R05 Cough: Secondary | ICD-10-CM | POA: Diagnosis not present

## 2018-11-03 DIAGNOSIS — R0981 Nasal congestion: Secondary | ICD-10-CM | POA: Diagnosis not present

## 2018-11-08 DIAGNOSIS — R05 Cough: Secondary | ICD-10-CM | POA: Diagnosis not present

## 2019-02-13 ENCOUNTER — Other Ambulatory Visit: Payer: Self-pay

## 2019-02-14 ENCOUNTER — Ambulatory Visit: Payer: Self-pay | Admitting: Family Medicine

## 2019-02-17 DIAGNOSIS — Z6832 Body mass index (BMI) 32.0-32.9, adult: Secondary | ICD-10-CM | POA: Diagnosis not present

## 2019-02-17 DIAGNOSIS — T63301A Toxic effect of unspecified spider venom, accidental (unintentional), initial encounter: Secondary | ICD-10-CM | POA: Diagnosis not present

## 2019-02-20 DIAGNOSIS — W57XXXD Bitten or stung by nonvenomous insect and other nonvenomous arthropods, subsequent encounter: Secondary | ICD-10-CM | POA: Diagnosis not present

## 2019-02-20 DIAGNOSIS — S30861D Insect bite (nonvenomous) of abdominal wall, subsequent encounter: Secondary | ICD-10-CM | POA: Diagnosis not present

## 2019-02-20 DIAGNOSIS — L02213 Cutaneous abscess of chest wall: Secondary | ICD-10-CM | POA: Diagnosis not present

## 2019-02-20 DIAGNOSIS — I499 Cardiac arrhythmia, unspecified: Secondary | ICD-10-CM | POA: Diagnosis not present

## 2019-02-22 DIAGNOSIS — L0291 Cutaneous abscess, unspecified: Secondary | ICD-10-CM | POA: Diagnosis not present

## 2019-02-22 DIAGNOSIS — Z6832 Body mass index (BMI) 32.0-32.9, adult: Secondary | ICD-10-CM | POA: Diagnosis not present

## 2019-03-08 ENCOUNTER — Other Ambulatory Visit: Payer: Self-pay

## 2019-03-10 ENCOUNTER — Encounter: Payer: Self-pay | Admitting: Family Medicine

## 2019-03-10 ENCOUNTER — Other Ambulatory Visit: Payer: Self-pay

## 2019-03-10 ENCOUNTER — Ambulatory Visit (INDEPENDENT_AMBULATORY_CARE_PROVIDER_SITE_OTHER): Payer: BC Managed Care – PPO

## 2019-03-10 ENCOUNTER — Ambulatory Visit (INDEPENDENT_AMBULATORY_CARE_PROVIDER_SITE_OTHER): Payer: BC Managed Care – PPO | Admitting: Family Medicine

## 2019-03-10 VITALS — BP 135/86 | HR 80 | Temp 98.7°F | Resp 20 | Ht 68.0 in | Wt 216.0 lb

## 2019-03-10 DIAGNOSIS — Z77090 Contact with and (suspected) exposure to asbestos: Secondary | ICD-10-CM

## 2019-03-10 DIAGNOSIS — R053 Chronic cough: Secondary | ICD-10-CM

## 2019-03-10 DIAGNOSIS — E1165 Type 2 diabetes mellitus with hyperglycemia: Secondary | ICD-10-CM | POA: Insufficient documentation

## 2019-03-10 DIAGNOSIS — G4719 Other hypersomnia: Secondary | ICD-10-CM

## 2019-03-10 DIAGNOSIS — R5383 Other fatigue: Secondary | ICD-10-CM

## 2019-03-10 DIAGNOSIS — R05 Cough: Secondary | ICD-10-CM | POA: Diagnosis not present

## 2019-03-10 DIAGNOSIS — Z130 Encounter for screening for diseases of the blood and blood-forming organs and certain disorders involving the immune mechanism: Secondary | ICD-10-CM | POA: Diagnosis not present

## 2019-03-10 DIAGNOSIS — E119 Type 2 diabetes mellitus without complications: Secondary | ICD-10-CM

## 2019-03-10 DIAGNOSIS — Z72820 Sleep deprivation: Secondary | ICD-10-CM

## 2019-03-10 LAB — BAYER DCA HB A1C WAIVED: HB A1C (BAYER DCA - WAIVED): 11.8 % — ABNORMAL HIGH (ref <7.0)

## 2019-03-10 NOTE — Patient Instructions (Signed)

## 2019-03-10 NOTE — Progress Notes (Signed)
New Patient Office Visit  Assessment & Plan:  1. Diabetes mellitus without complication ([place]) Lab Results  Component Value Date   HGBA1C 11.8 (H) 03/10/2019  - Diabetes is not at goal of A1c < 7. - Medications: metformin increased from 500 mg BID to 1,000 mg BID; glimepiride continued at 4 mg QD - Home glucose monitoring: not currently; glucometer ordered  - Patient is not currently taking a statin. Patient is not taking an ACE-inhibitor/ARB.  - Last diabetic eye exam: within the past year - record requested from [facility] in [place] - Urine Microalbumin/Creat Ratio: 03/10/2019 - Instruction/counseling given: discussed the need for weight loss, discussed diet and provided printed educational material - CMP14+EGFR - Lipid panel - Bayer DCA Hb A1c Waived - Microalbumin / creatinine urine ratio - metFORMIN (GLUCOPHAGE) 500 MG tablet; Take 2 tablets (1,000 mg total) by mouth 2 (two) times daily with a meal.  Dispense: 360 tablet; Refill: 0 - glimepiride (AMARYL) 4 MG tablet; Take 1 tablet (4 mg total) by mouth daily.  Dispense: 90 tablet; Refill: 0  2-3. Exposure to asbestos/Chronic cough - DG Chest 2 View; Future  4-6. Fatigue, unspecified type/Excessive daytime sleepiness/Poor sleep - Discussed improving diabetes management and assessing vitamin D level. Once these are correct if patient is still having symptoms, will proceed with sleep study.  - VITAMIN D 25 Hydroxy (Vit-D Deficiency, Fractures)  7. Screening for deficiency anemia - CBC with Differential/Platelet   Follow-up: Return in about 3 months (around 06/10/2019) for DM.   Hendricks Limes, MSN, APRN, FNP-C [facility]  Subjective:  Patient ID: Brendan Stone, male    DOB: 16-Aug-1965  Age: 53 y.o. MRN: [id number]  Patient Care Team: Loman Brooklyn, FNP as PCP - General (Family Medicine)  CC:  Chief Complaint  Patient presents with  . Establish Care    HPI [facility] presents toestablish  care. Patient is transferring care from Dr. Loa Socks due to a move.    Diabetes: Patient presents for follow up of diabetes. Current symptoms include: paresthesia of the feet. Patient describes a sensation of pins and needles in both feet. States he has been unable to feel his feet x2 years. Symptoms have gradually worsened. Known diabetic complications: none. Cardiovascular risk factors: diabetes mellitus, male gender and obesity (BMI >= 30 kg/m2). Current diabetic medications include oral agents (dual therapy): glimepiride (Amaryl), metformin (generic). Eye exam current (within one year): yes. Weight trend: decreasing steadily. Prior visit with dietician: no. Current diet: in general, an "unhealthy" diet. Current exercise: walking. Current monitoring regimen: none. Is he on ACE inhibitor or angiotensin II receptor blocker? no.  Patient reports he feels very fatigued on a daily basis. He does experience excessive daytime sleepiness. He is not sure if he snores or pauses in breathing during the night as he lives alone and has nobody to tell him. He does not feel he gets adequate sleep.   STOP-BANG: Height: _0  (1.727 m)  Weight: 216 lbs   BMI: Body mass index is 32.84 kg/m.  Neck circumference: 47.5 cm  1. Do you snore loudly (louder than talking or loud enough to be heard through closed doors)?  Unknown 2. Do you often feel tired, fatigued, or sleepy during daytime?  Yes 3. Has anyone observed you stop breathing during your sleep?  Unknown 4. Do you have or are you being treated for high blood pressure?  No 5. BMI more than 35 kg/m2?  No 6. Age over  50 years?  Yes 7. Neck circumference greater than 40 cm? 47.5  Yes 8. Male gender?  Yes  Total "Yes" Answers: 4   He is also concerned about a persistent cough. It is productive at time. He used to work at [employer] and has already been notified of his exposure to asbestosis. He has had attorneys call him and recommend imaging,  specifically "from the front and back". Patient denies shortness of breath at rest or with exertion.    Review of Systems  Constitutional: Negative for chills, fever, malaise/fatigue and weight loss.  HENT: Negative for congestion, ear discharge, ear pain, nosebleeds, sinus pain, sore throat and tinnitus.   Eyes: Negative for blurred vision, double vision, pain, discharge and redness.  Respiratory: Negative for cough, shortness of breath and wheezing.   Cardiovascular: Negative for chest pain, palpitations and leg swelling.  Gastrointestinal: Negative for abdominal pain, constipation, diarrhea, heartburn, nausea and vomiting.  Genitourinary: Negative for dysuria, frequency and urgency.       Denies trouble initiating a urine stream, weak stream, split stream, and dribbling.   Musculoskeletal: Negative for myalgias.  Skin: Negative for rash.  Neurological: Negative for dizziness, seizures, weakness and headaches.  Psychiatric/Behavioral: Negative for depression, substance abuse and suicidal ideas. The patient is not nervous/anxious.     Current Outpatient Medications:  .  glimepiride (AMARYL) 4 MG tablet, Take 1 tablet (4 mg total) by mouth daily., Disp: 90 tablet, Rfl: 0 .  metFORMIN (GLUCOPHAGE) 500 MG tablet, Take 2 tablets (1,000 mg total) by mouth 2 (two) times daily with a meal., Disp: 360 tablet, Rfl: 0  Allergies  Allergen Reactions  . Tape Rash    Past Medical History:  Diagnosis Date  . Back pain   . Diabetes mellitus without complication ([place])   . Pneumonia     Past Surgical History:  Procedure Laterality Date  . HAND SURGERY     rt  . REPAIR KNEE LIGAMENT     rt  . ROTATOR CUFF REPAIR     rt    Family History  Problem Relation Age of Onset  . Breast cancer Mother   . Heart disease Father   . Diabetes Father   . Cancer Maternal Grandmother        unknown type  . Stroke Maternal Grandfather   . Stroke Paternal Grandmother   . Appendicitis Paternal  Grandfather        cause of death    Social History   Socioeconomic History  . Marital status: Legally Separated    Spouse name: Not on file  . Number of children: Not on file  . Years of education: Not on file  . Highest education level: Not on file  Occupational History  . Not on file  Social Needs  . Financial resource strain: Not on file  . Food insecurity    Worry: Not on file    Inability: Not on file  . Transportation needs    Medical: Not on file    Non-medical: Not on file  Tobacco Use  . Smoking status: Never Smoker  . Smokeless tobacco: Never Used  Substance and Sexual Activity  . Alcohol use: Yes    Comment: occas  . Drug use: No  . Sexual activity: Not on file  Lifestyle  . Physical activity    Days per week: Not on file    Minutes per session: Not on file  . Stress: Not on file  Relationships  . Social  connections    Talks on phone: Not on file    Gets together: Not on file    Attends religious service: Not on file    Active member of club or organization: Not on file    Attends meetings of clubs or organizations: Not on file    Relationship status: Not on file  . Intimate partner violence    Fear of current or ex partner: Not on file    Emotionally abused: Not on file    Physically abused: Not on file    Forced sexual activity: Not on file  Other Topics Concern  . Not on file  Social History Narrative  . Not on file    Objective:   Today's Vitals: BP 135/86   Pulse 80   Temp 98.7 F (37.1 C) (Temporal)   Resp 20   Ht _0  (1.727 m)   Wt 216 lb (98 kg)   SpO2 98%   BMI 32.84 kg/m   Physical Exam Vitals signs reviewed.  Constitutional:      General: He is not in acute distress.    Appearance: Normal appearance. He is obese. He is not ill-appearing, toxic-appearing or diaphoretic.  HENT:     Head: Normocephalic and atraumatic.  Eyes:     General: No scleral icterus.       Right eye: No discharge.        Left eye: No discharge.      Conjunctiva/sclera: Conjunctivae normal.  Neck:     Musculoskeletal: Normal range of motion.  Cardiovascular:     Rate and Rhythm: Normal rate and regular rhythm.     Heart sounds: Normal heart sounds. No murmur. No friction rub. No gallop.   Pulmonary:     Effort: Pulmonary effort is normal. No respiratory distress.     Breath sounds: Normal breath sounds. No stridor. No wheezing, rhonchi or rales.  Musculoskeletal: Normal range of motion.  Skin:    General: Skin is warm and dry.  Neurological:     Mental Status: He is alert and oriented to person, place, and time. Mental status is at baseline.  Psychiatric:        Mood and Affect: Mood normal.        Behavior: Behavior normal.        Thought Content: Thought content normal.        Judgment: Judgment normal.

## 2019-03-11 DIAGNOSIS — Z72820 Sleep deprivation: Secondary | ICD-10-CM | POA: Insufficient documentation

## 2019-03-11 DIAGNOSIS — G4719 Other hypersomnia: Secondary | ICD-10-CM | POA: Insufficient documentation

## 2019-03-11 DIAGNOSIS — R5383 Other fatigue: Secondary | ICD-10-CM | POA: Insufficient documentation

## 2019-03-11 DIAGNOSIS — Z77090 Contact with and (suspected) exposure to asbestos: Secondary | ICD-10-CM | POA: Insufficient documentation

## 2019-03-11 DIAGNOSIS — R05 Cough: Secondary | ICD-10-CM | POA: Insufficient documentation

## 2019-03-11 DIAGNOSIS — R053 Chronic cough: Secondary | ICD-10-CM | POA: Insufficient documentation

## 2019-03-11 LAB — CMP14+EGFR
ALT: 29 IU/L (ref 0–44)
AST: 21 IU/L (ref 0–40)
Albumin/Globulin Ratio: 1.7 (ref 1.2–2.2)
Albumin: 4.5 g/dL (ref 3.8–4.9)
Alkaline Phosphatase: 67 IU/L (ref 39–117)
BUN/Creatinine Ratio: 18 (ref 9–20)
BUN: 17 mg/dL (ref 6–24)
Bilirubin Total: 0.3 mg/dL (ref 0.0–1.2)
CO2: 24 mmol/L (ref 20–29)
Calcium: 9 mg/dL (ref 8.7–10.2)
Chloride: 103 mmol/L (ref 96–106)
Creatinine, Ser: 0.96 mg/dL (ref 0.76–1.27)
GFR calc Af Amer: 104 mL/min/{1.73_m2} (ref >59)
GFR calc non Af Amer: 90 mL/min/{1.73_m2} (ref >59)
Globulin, Total: 2.7 g/dL (ref 1.5–4.5)
Glucose: 191 mg/dL — ABNORMAL HIGH (ref 65–99)
Potassium: 4.5 mmol/L (ref 3.5–5.2)
Sodium: 141 mmol/L (ref 134–144)
Total Protein: 7.2 g/dL (ref 6.0–8.5)

## 2019-03-11 LAB — CBC WITH DIFFERENTIAL/PLATELET
Basophils Absolute: 0.2 10*3/uL (ref 0.0–0.2)
Basos: 3 %
EOS (ABSOLUTE): 0.3 10*3/uL (ref 0.0–0.4)
Eos: 5 %
Hematocrit: 44.2 % (ref 37.5–51.0)
Hemoglobin: 14.7 g/dL (ref 13.0–17.7)
Immature Grans (Abs): 0.1 10*3/uL (ref 0.0–0.1)
Immature Granulocytes: 1 %
Lymphocytes Absolute: 2.2 10*3/uL (ref 0.7–3.1)
Lymphs: 34 %
MCH: 28.7 pg (ref 26.6–33.0)
MCHC: 33.3 g/dL (ref 31.5–35.7)
MCV: 86 fL (ref 79–97)
Monocytes Absolute: 0.6 10*3/uL (ref 0.1–0.9)
Monocytes: 9 %
Neutrophils Absolute: 3.1 10*3/uL (ref 1.4–7.0)
Neutrophils: 48 %
Platelets: 234 10*3/uL (ref 150–450)
RBC: 5.12 x10E6/uL (ref 4.14–5.80)
RDW: 12.6 % (ref 11.6–15.4)
WBC: 6.3 10*3/uL (ref 3.4–10.8)

## 2019-03-11 LAB — MICROALBUMIN / CREATININE URINE RATIO
Creatinine, Urine: 121.5 mg/dL
Microalb/Creat Ratio: 42 mg/g creat — ABNORMAL HIGH (ref 0–29)
Microalbumin, Urine: 50.9 ug/mL

## 2019-03-11 LAB — LIPID PANEL
Chol/HDL Ratio: 5.9 ratio — ABNORMAL HIGH (ref 0.0–5.0)
Cholesterol, Total: 184 mg/dL (ref 100–199)
HDL: 31 mg/dL — ABNORMAL LOW (ref >39)
LDL Chol Calc (NIH): 118 mg/dL — ABNORMAL HIGH (ref 0–99)
Triglycerides: 199 mg/dL — ABNORMAL HIGH (ref 0–149)
VLDL Cholesterol Cal: 35 mg/dL (ref 5–40)

## 2019-03-11 LAB — VITAMIN D 25 HYDROXY (VIT D DEFICIENCY, FRACTURES): Vit D, 25-Hydroxy: 17.2 ng/mL — ABNORMAL LOW (ref 30.0–100.0)

## 2019-03-11 MED ORDER — BLOOD GLUCOSE MONITOR KIT: PACK | 0 refills | Status: DC

## 2019-03-11 MED ORDER — GLIMEPIRIDE 4 MG PO TABS: 4.0000 mg | ORAL_TABLET | Freq: Every day | ORAL | 0 refills | Status: DC

## 2019-03-11 MED ORDER — METFORMIN HCL 500 MG PO TABS: 1000.0000 mg | ORAL_TABLET | Freq: Two times a day (BID) | ORAL | 0 refills | Status: DC

## 2019-03-12 ENCOUNTER — Encounter: Payer: Self-pay | Admitting: Family Medicine

## 2019-03-12 DIAGNOSIS — E785 Hyperlipidemia, unspecified: Secondary | ICD-10-CM | POA: Insufficient documentation

## 2019-03-13 ENCOUNTER — Other Ambulatory Visit: Payer: Self-pay | Admitting: Family Medicine

## 2019-03-13 ENCOUNTER — Telehealth: Payer: Self-pay | Admitting: Family Medicine

## 2019-03-13 DIAGNOSIS — E782 Mixed hyperlipidemia: Secondary | ICD-10-CM

## 2019-03-13 MED ORDER — ATORVASTATIN CALCIUM 10 MG PO TABS: 10.0000 mg | ORAL_TABLET | Freq: Every day | ORAL | 2 refills | Status: DC

## 2019-03-13 NOTE — Telephone Encounter (Signed)
Aware of results. 

## 2019-06-12 ENCOUNTER — Other Ambulatory Visit: Payer: Self-pay

## 2019-06-12 ENCOUNTER — Telehealth: Payer: Self-pay | Admitting: Family Medicine

## 2019-06-13 ENCOUNTER — Ambulatory Visit: Payer: Self-pay | Admitting: Family Medicine

## 2019-06-13 ENCOUNTER — Telehealth: Payer: Self-pay | Admitting: Family Medicine

## 2019-06-13 NOTE — Telephone Encounter (Signed)
New appt made

## 2019-06-14 ENCOUNTER — Encounter: Payer: Self-pay | Admitting: Family Medicine

## 2019-06-26 ENCOUNTER — Other Ambulatory Visit: Payer: Self-pay

## 2019-06-28 ENCOUNTER — Ambulatory Visit: Payer: BC Managed Care – PPO | Admitting: Family Medicine

## 2019-06-28 ENCOUNTER — Encounter: Payer: Self-pay | Admitting: Family Medicine

## 2019-06-28 ENCOUNTER — Other Ambulatory Visit: Payer: Self-pay | Admitting: Family Medicine

## 2019-06-28 ENCOUNTER — Other Ambulatory Visit: Payer: Self-pay

## 2019-06-28 VITALS — BP 136/77 | HR 82 | Temp 97.2°F | Ht 68.0 in | Wt 225.0 lb

## 2019-06-28 DIAGNOSIS — E119 Type 2 diabetes mellitus without complications: Secondary | ICD-10-CM | POA: Diagnosis not present

## 2019-06-28 DIAGNOSIS — G4719 Other hypersomnia: Secondary | ICD-10-CM

## 2019-06-28 DIAGNOSIS — M65342 Trigger finger, left ring finger: Secondary | ICD-10-CM

## 2019-06-28 DIAGNOSIS — R5383 Other fatigue: Secondary | ICD-10-CM

## 2019-06-28 DIAGNOSIS — Z72820 Sleep deprivation: Secondary | ICD-10-CM

## 2019-06-28 DIAGNOSIS — M65341 Trigger finger, right ring finger: Secondary | ICD-10-CM | POA: Diagnosis not present

## 2019-06-28 DIAGNOSIS — E782 Mixed hyperlipidemia: Secondary | ICD-10-CM

## 2019-06-28 LAB — BAYER DCA HB A1C WAIVED: HB A1C (BAYER DCA - WAIVED): 9.3 % — ABNORMAL HIGH (ref <7.0)

## 2019-06-28 MED ORDER — LISINOPRIL 2.5 MG PO TABS: 2.5000 mg | ORAL_TABLET | Freq: Every day | ORAL | 2 refills | Status: DC

## 2019-06-28 MED ORDER — ATORVASTATIN CALCIUM 10 MG PO TABS: 10.0000 mg | ORAL_TABLET | Freq: Every day | ORAL | 2 refills | Status: DC

## 2019-06-28 MED ORDER — METFORMIN HCL 500 MG PO TABS: 1000.0000 mg | ORAL_TABLET | Freq: Two times a day (BID) | ORAL | 0 refills | Status: DC

## 2019-06-28 MED ORDER — SITAGLIPTIN PHOSPHATE 25 MG PO TABS: 25.0000 mg | ORAL_TABLET | Freq: Every day | ORAL | 2 refills | Status: DC

## 2019-06-28 MED ORDER — GLIMEPIRIDE 4 MG PO TABS: 4.0000 mg | ORAL_TABLET | Freq: Every day | ORAL | 0 refills | Status: DC

## 2019-06-28 NOTE — Patient Instructions (Signed)
Trigger Finger  Trigger finger, also called stenosing tenosynovitis,  is a condition that causes a finger to get stuck in a bent position. Each finger has a tendon, which is a tough, cord-like tissue that connects muscle to bone, and each tendon passes through a tunnel of tissue called a tendon sheath. To move your finger, your tendon needs to glide freely through the sheath. Trigger finger happens when the tendon or the sheath thickens, making it difficult to move your finger. Trigger finger can affect any finger or a thumb. It may affect more than one finger. Mild cases may clear up with rest and medicine. Severe cases require more treatment. What are the causes? Trigger finger is caused by a thickened finger tendon or tendon sheath. The cause of this thickening is not known. What increases the risk? The following factors may make you more likely to develop this condition:  Doing activities that require a strong grip.  Having rheumatoid arthritis, gout, or diabetes.  Being 40-60 years old.  Being male. What are the signs or symptoms? Symptoms of this condition include:  Pain when bending or straightening your finger.  Tenderness or swelling where your finger attaches to the palm of your hand.  A lump in the palm of your hand or on the inside of your finger.  Hearing a noise like a pop or a snap when you try to straighten your finger.  Feeling a catching or locking sensation when you try to straighten your finger.  Being unable to straighten your finger. How is this diagnosed? This condition is diagnosed based on your symptoms and a physical exam. How is this treated? This condition may be treated by:  Resting your finger and avoiding activities that make symptoms worse.  Wearing a finger splint to keep your finger extended.  Taking NSAIDs, such as ibuprofen, to relieve pain and swelling.  Doing gentle exercises to stretch the finger as told by your health care provider.   Having medicine that reduces swelling and inflammation (steroids) injected into the tendon sheath. Injections may need to be repeated.  Having surgery to open the tendon sheath. This may be done if other treatments do not work and you cannot straighten your finger. You may need physical therapy after surgery. Follow these instructions at home: If you have a splint:  Wear the splint as told by your health care provider. Remove it only as told by your health care provider.  Loosen it if your fingers tingle, become numb, or turn cold and blue.  Keep it clean.  If the splint is not waterproof: ? Do not let it get wet. ? Cover it with a watertight covering when you take a bath or shower. Managing pain, stiffness, and swelling     If directed, apply heat to the affected area as often as told by your health care provider. Use the heat source that your health care provider recommends, such as a moist heat pack or a heating pad.  Place a towel between your skin and the heat source.  Leave the heat on for 20-30 minutes.  Remove the heat if your skin turns bright red. This is especially important if you are unable to feel pain, heat, or cold. You may have a greater risk of getting burned. If directed, put ice on the painful area. To do this:  If you have a removable splint, remove it as told by your health care provider.  Put ice in a plastic bag.  Place a   towel between your skin and the bag or between your splint and the bag.  Leave the ice on for 20 minutes, 2-3 times a day.  Activity  Rest your finger as told by your health care provider. Avoid activities that make the pain worse.  Return to your normal activities as told by your health care provider. Ask your health care provider what activities are safe for you.  Do exercises as told by your health care provider.  Ask your health care provider when it is safe to drive if you have a splint on your hand. General instructions   Take over-the-counter and prescription medicines only as told by your health care provider.  Keep all follow-up visits as told by your health care provider. This is important. Contact a health care provider if:  Your symptoms are not improving with home care. Summary  Trigger finger, also called stenosing tenosynovitis, causes your finger to get stuck in a bent position. This can make it difficult and painful to straighten your finger.  This condition develops when a finger tendon or tendon sheath thickens.  Treatment may include resting your finger, wearing a splint, and taking medicines.  In severe cases, surgery to open the tendon sheath may be needed. This information is not intended to replace advice given to you by your health care provider. Make sure you discuss any questions you have with your health care provider. Document Revised: 09/26/2018 Document Reviewed: 09/26/2018 Elsevier Patient Education  2020 [employer]  

## 2019-06-28 NOTE — Progress Notes (Signed)
Assessment & Plan:  1. Diabetes mellitus without complication ([place]) Lab Results  Component Value Date   HGBA1C 9.3 (H) 06/28/2019   HGBA1C 11.8 (H) 03/10/2019   - Diabetes is not at goal of A1c < 7, but is improving. - Medications: continue current medications, add Januvia 25 mg QD - Patient is currently taking a statin. Patient is taking an ACE-inhibitor/ARB.  - Last foot exam: 06/28/2019 - Last diabetic eye exam: > 1 year ago; encouraged patient to schedule - Urine Microalbumin/Creat Ratio: 03/10/2019 - Instruction/counseling given: discussed foot care - Bayer DCA Hb A1c Waived - metFORMIN (GLUCOPHAGE) 500 MG tablet; Take 2 tablets (1,000 mg total) by mouth 2 (two) times daily with a meal.  Dispense: 360 tablet; Refill: 0 - glimepiride (AMARYL) 4 MG tablet; Take 1 tablet (4 mg total) by mouth daily.  Dispense: 90 tablet; Refill: 0 - atorvastatin (LIPITOR) 10 MG tablet; Take 1 tablet (10 mg total) by mouth daily.  Dispense: 30 tablet; Refill: 2 - sitaGLIPtin (JANUVIA) 25 MG tablet; Take 1 tablet (25 mg total) by mouth daily.  Dispense: 30 tablet; Refill: 2 - lisinopril (ZESTRIL) 2.5 MG tablet; Take 1 tablet (2.5 mg total) by mouth daily.  Dispense: 30 tablet; Refill: 2 - CMP14+EGFR  2. Mixed hyperlipidemia - Elevated with last lipid panel on 03/10/2019. Patient has been taking atorvastatin since then. Will re-check lipid panel at next office visit in 3 months.  - atorvastatin (LIPITOR) 10 MG tablet; Take 1 tablet (10 mg total) by mouth daily.  Dispense: 30 tablet; Refill: 2 - CMP14+EGFR  3-4. Trigger ring finger of right hand/Trigger ring finger of left hand - Education provided on trigger finger.  - Ambulatory referral to Orthopedic Surgery  5-7. Fatigue, unspecified type/Excessive daytime sleepiness/Poor sleep - Ambulatory referral to Pulmonology   Return in about 3 months (around 09/25/2019) for DM.  Brendan Limes, MSN, APRN, FNP-C [facility]  Subjective:    Patient ID: Brendan Stone, male    DOB: [birth year], 54 y.o.   MRN: [id number]  Patient Care Team: Brendan Brooklyn, FNP as PCP - General (Family Medicine)   Chief Complaint:  Chief Complaint  Patient presents with  . Medical Management of Chronic Issues  . Diabetes    HPI: Brendan Stone is a 54 y.o. male presenting on 06/28/2019 for Medical Management of Chronic Issues and Diabetes  Diabetes: Patient presents for follow up of diabetes. Current symptoms include: paresthesia of the feet. Known diabetic complications: nephropathy and peripheral neuropathy. Medication compliance: patient takes glimepiride daily as prescribed, however he reports he often forgets the evening dose of metformin. Current diet: in general, a "healthy" diet  . Current exercise: walking.  Is he  on ACE inhibitor or angiotensin II receptor blocker? No. Is he on a statin? Yes.   Lab Results  Component Value Date   HGBA1C 9.3 (H) 06/28/2019   HGBA1C 11.8 (H) 03/10/2019   Lab Results  Component Value Date   LDLCALC 118 (H) 03/10/2019   CREATININE 0.93 06/28/2019     New complaints: Patient c/o trigger finger of bilateral ring fingers that has been going on for several months.   Patient would like to proceed with a sleep study.  Patient reports he feels very fatigued on a daily basis. He does experience excessive daytime sleepiness. He is not sure if he snores or pauses in breathing during the night as he lives alone and has nobody to tell him. He does not feel  he gets adequate sleep.   STOP-BANG: Height: 5' 8" (1.727 m)            Weight: 225 lbs   BMI: Body mass index is 34.21 kg/m.  Neck circumference: 47.5 cm  1. Do you snore loudly (louder than talking or loud enough to be heard through closed doors)?             Unknown 2. Do you often feel tired, fatigued, or sleepy during daytime?             Yes 3. Has anyone observed you stop breathing during your sleep?              Unknown 4. Do you have or are you being treated for high blood pressure?             No 5. BMI more than 35 kg/m2?             No 6. Age over 54 years?             Yes 7. Neck circumference greater than 40 cm? 47.5 cm             Yes 8. Male gender?             Yes  Total "Yes" Answers: 4  Social history:  Relevant past medical, surgical, family and social history reviewed and updated as indicated. Interim medical history since our last visit reviewed.  Allergies and medications reviewed and updated.  DATA REVIEWED: CHART IN EPIC  ROS: Negative unless specifically indicated above in HPI.    Current Outpatient Medications:  .  atorvastatin (LIPITOR) 10 MG tablet, Take 1 tablet (10 mg total) by mouth daily., Disp: 30 tablet, Rfl: 2 .  blood glucose meter kit and supplies KIT, Dispense based on patient and insurance preference. Use up to four times daily as directed for DM type 2., Disp: 1 each, Rfl: 0 .  calcium-vitamin D (OSCAL WITH D) 500-200 MG-UNIT tablet, Take 1 tablet by mouth., Disp: , Rfl:  .  CINNAMON PO, Take by mouth., Disp: , Rfl:  .  glimepiride (AMARYL) 4 MG tablet, Take 1 tablet (4 mg total) by mouth daily., Disp: 90 tablet, Rfl: 0 .  Lancets (ACCU-CHEK SOFT TOUCH) lancets, 1 each by Misc.(Non-Drug; Combo Route) route 2 times daily., Disp: , Rfl:  .  Lancets (ONETOUCH DELICA PLUS [id number]) MISC, , Disp: , Rfl:  .  ONETOUCH VERIO test strip, , Disp: , Rfl:  .  lisinopril (ZESTRIL) 2.5 MG tablet, Take 1 tablet (2.5 mg total) by mouth daily., Disp: 30 tablet, Rfl: 2 .  metFORMIN (GLUCOPHAGE) 500 MG tablet, Take 2 tablets (1,000 mg total) by mouth 2 (two) times daily with a meal., Disp: 360 tablet, Rfl: 0 .  sitaGLIPtin (JANUVIA) 25 MG tablet, Take 1 tablet (25 mg total) by mouth daily., Disp: 30 tablet, Rfl: 2   Allergies  Allergen Reactions  . Tape Rash   Past Medical History:  Diagnosis Date  . Back pain   . Diabetes mellitus without complication ([place])   .  Hyperlipidemia   . Pneumonia   . Vitamin D deficiency     Past Surgical History:  Procedure Laterality Date  . HAND SURGERY     rt  . REPAIR KNEE LIGAMENT     rt  . ROTATOR CUFF REPAIR     rt    Social History   Socioeconomic History  . Marital status: Legally Separated  Spouse name: Not on file  . Number of children: Not on file  . Years of education: Not on file  . Highest education level: Not on file  Occupational History  . Not on file  Tobacco Use  . Smoking status: Never Smoker  . Smokeless tobacco: Never Used  Substance and Sexual Activity  . Alcohol use: Yes    Comment: occas  . Drug use: No  . Sexual activity: Not on file  Other Topics Concern  . Not on file  Social History Narrative  . Not on file   Social Determinants of Health   Financial Resource Strain:   . Difficulty of Paying Living Expenses: Not on file  Food Insecurity:   . Worried About Charity fundraiser in the Last Year: Not on file  . Ran Out of Food in the Last Year: Not on file  Transportation Needs:   . Lack of Transportation (Medical): Not on file  . Lack of Transportation (Non-Medical): Not on file  Physical Activity:   . Days of Exercise per Week: Not on file  . Minutes of Exercise per Session: Not on file  Stress:   . Feeling of Stress : Not on file  Social Connections:   . Frequency of Communication with Friends and Family: Not on file  . Frequency of Social Gatherings with Friends and Family: Not on file  . Attends Religious Services: Not on file  . Active Member of Clubs or Organizations: Not on file  . Attends Archivist Meetings: Not on file  . Marital Status: Not on file  Intimate Partner Violence:   . Fear of Current or Ex-Partner: Not on file  . Emotionally Abused: Not on file  . Physically Abused: Not on file  . Sexually Abused: Not on file        Objective:    BP 136/77   Pulse 82   Temp (!) 97.2 F (36.2 C) (Temporal)   Ht 5' 8" (1.727 m)    Wt 225 lb (102.1 kg)   SpO2 98%   BMI 34.21 kg/m   Physical Exam Vitals reviewed.  Constitutional:      General: He is not in acute distress.    Appearance: Normal appearance. He is obese. He is not ill-appearing, toxic-appearing or diaphoretic.  HENT:     Head: Normocephalic and atraumatic.  Eyes:     General: No scleral icterus.       Right eye: No discharge.        Left eye: No discharge.     Conjunctiva/sclera: Conjunctivae normal.  Cardiovascular:     Rate and Rhythm: Normal rate and regular rhythm.     Stone sounds: Normal Stone sounds. No murmur. No friction rub. No gallop.   Pulmonary:     Effort: Pulmonary effort is normal. No respiratory distress.     Breath sounds: Normal breath sounds. No stridor. No wheezing, rhonchi or rales.  Musculoskeletal:        General: Normal range of motion.     Cervical back: Normal range of motion.  Skin:    General: Skin is warm and dry.  Neurological:     Mental Status: He is alert and oriented to person, place, and time. Mental status is at baseline.  Psychiatric:        Mood and Affect: Mood normal.        Behavior: Behavior normal.        Thought Content: Thought content normal.  Judgment: Judgment normal.    No results found for: TSH Lab Results  Component Value Date   WBC 6.3 03/10/2019   HGB 14.7 03/10/2019   HCT 44.2 03/10/2019   MCV 86 03/10/2019   PLT 234 03/10/2019   Lab Results  Component Value Date   NA 140 06/28/2019   K 4.4 06/28/2019   CO2 25 06/28/2019   GLUCOSE 269 (H) 06/28/2019   BUN 12 06/28/2019   CREATININE 0.93 06/28/2019   BILITOT 0.2 06/28/2019   ALKPHOS 70 06/28/2019   AST 20 06/28/2019   ALT 37 06/28/2019   PROT 7.3 06/28/2019   ALBUMIN 4.7 06/28/2019   CALCIUM 9.7 06/28/2019   ANIONGAP 11 09/15/2017   Lab Results  Component Value Date   CHOL 184 03/10/2019   Lab Results  Component Value Date   HDL 31 (L) 03/10/2019   Lab Results  Component Value Date   LDLCALC 118 (H)  03/10/2019   Lab Results  Component Value Date   TRIG 199 (H) 03/10/2019   Lab Results  Component Value Date   CHOLHDL 5.9 (H) 03/10/2019   Lab Results  Component Value Date   HGBA1C 9.3 (H) 06/28/2019

## 2019-06-29 ENCOUNTER — Encounter: Payer: Self-pay | Admitting: Family Medicine

## 2019-06-29 ENCOUNTER — Telehealth: Payer: Self-pay | Admitting: Family Medicine

## 2019-06-29 LAB — CMP14+EGFR
ALT: 37 IU/L (ref 0–44)
AST: 20 IU/L (ref 0–40)
Albumin/Globulin Ratio: 1.8 (ref 1.2–2.2)
Albumin: 4.7 g/dL (ref 3.8–4.9)
Alkaline Phosphatase: 70 IU/L (ref 39–117)
BUN/Creatinine Ratio: 13 (ref 9–20)
BUN: 12 mg/dL (ref 6–24)
Bilirubin Total: 0.2 mg/dL (ref 0.0–1.2)
CO2: 25 mmol/L (ref 20–29)
Calcium: 9.7 mg/dL (ref 8.7–10.2)
Chloride: 101 mmol/L (ref 96–106)
Creatinine, Ser: 0.93 mg/dL (ref 0.76–1.27)
GFR calc Af Amer: 108 mL/min/{1.73_m2} (ref >59)
GFR calc non Af Amer: 93 mL/min/{1.73_m2} (ref >59)
Globulin, Total: 2.6 g/dL (ref 1.5–4.5)
Glucose: 269 mg/dL — ABNORMAL HIGH (ref 65–99)
Potassium: 4.4 mmol/L (ref 3.5–5.2)
Sodium: 140 mmol/L (ref 134–144)
Total Protein: 7.3 g/dL (ref 6.0–8.5)

## 2019-06-30 ENCOUNTER — Encounter: Payer: Self-pay | Admitting: Family Medicine

## 2019-07-03 ENCOUNTER — Telehealth: Payer: Self-pay | Admitting: Family Medicine

## 2019-07-10 DIAGNOSIS — Z6834 Body mass index (BMI) 34.0-34.9, adult: Secondary | ICD-10-CM | POA: Diagnosis not present

## 2019-07-10 DIAGNOSIS — M65341 Trigger finger, right ring finger: Secondary | ICD-10-CM | POA: Diagnosis not present

## 2019-07-24 DIAGNOSIS — M79645 Pain in left finger(s): Secondary | ICD-10-CM | POA: Diagnosis not present

## 2019-07-24 DIAGNOSIS — M25641 Stiffness of right hand, not elsewhere classified: Secondary | ICD-10-CM | POA: Diagnosis not present

## 2019-07-24 DIAGNOSIS — M65341 Trigger finger, right ring finger: Secondary | ICD-10-CM | POA: Diagnosis not present

## 2019-07-24 DIAGNOSIS — M25631 Stiffness of right wrist, not elsewhere classified: Secondary | ICD-10-CM | POA: Diagnosis not present

## 2019-07-26 DIAGNOSIS — M25631 Stiffness of right wrist, not elsewhere classified: Secondary | ICD-10-CM | POA: Diagnosis not present

## 2019-07-26 DIAGNOSIS — M25641 Stiffness of right hand, not elsewhere classified: Secondary | ICD-10-CM | POA: Diagnosis not present

## 2019-07-26 DIAGNOSIS — M79645 Pain in left finger(s): Secondary | ICD-10-CM | POA: Diagnosis not present

## 2019-07-26 DIAGNOSIS — M65341 Trigger finger, right ring finger: Secondary | ICD-10-CM | POA: Diagnosis not present

## 2019-08-04 DIAGNOSIS — M25631 Stiffness of right wrist, not elsewhere classified: Secondary | ICD-10-CM | POA: Diagnosis not present

## 2019-08-04 DIAGNOSIS — M25641 Stiffness of right hand, not elsewhere classified: Secondary | ICD-10-CM | POA: Diagnosis not present

## 2019-08-04 DIAGNOSIS — M79645 Pain in left finger(s): Secondary | ICD-10-CM | POA: Diagnosis not present

## 2019-08-04 DIAGNOSIS — M65341 Trigger finger, right ring finger: Secondary | ICD-10-CM | POA: Diagnosis not present

## 2019-08-07 DIAGNOSIS — M79645 Pain in left finger(s): Secondary | ICD-10-CM | POA: Diagnosis not present

## 2019-08-07 DIAGNOSIS — M65341 Trigger finger, right ring finger: Secondary | ICD-10-CM | POA: Diagnosis not present

## 2019-08-07 DIAGNOSIS — M25631 Stiffness of right wrist, not elsewhere classified: Secondary | ICD-10-CM | POA: Diagnosis not present

## 2019-08-07 DIAGNOSIS — M25641 Stiffness of right hand, not elsewhere classified: Secondary | ICD-10-CM | POA: Diagnosis not present

## 2019-08-11 DIAGNOSIS — M79645 Pain in left finger(s): Secondary | ICD-10-CM | POA: Diagnosis not present

## 2019-08-11 DIAGNOSIS — M65341 Trigger finger, right ring finger: Secondary | ICD-10-CM | POA: Diagnosis not present

## 2019-08-11 DIAGNOSIS — M25631 Stiffness of right wrist, not elsewhere classified: Secondary | ICD-10-CM | POA: Diagnosis not present

## 2019-08-11 DIAGNOSIS — M25641 Stiffness of right hand, not elsewhere classified: Secondary | ICD-10-CM | POA: Diagnosis not present

## 2019-08-16 DIAGNOSIS — M25641 Stiffness of right hand, not elsewhere classified: Secondary | ICD-10-CM | POA: Diagnosis not present

## 2019-08-16 DIAGNOSIS — M25631 Stiffness of right wrist, not elsewhere classified: Secondary | ICD-10-CM | POA: Diagnosis not present

## 2019-08-16 DIAGNOSIS — M65341 Trigger finger, right ring finger: Secondary | ICD-10-CM | POA: Diagnosis not present

## 2019-08-16 DIAGNOSIS — M79645 Pain in left finger(s): Secondary | ICD-10-CM | POA: Diagnosis not present

## 2019-08-23 DIAGNOSIS — M65341 Trigger finger, right ring finger: Secondary | ICD-10-CM | POA: Diagnosis not present

## 2019-08-23 DIAGNOSIS — M25641 Stiffness of right hand, not elsewhere classified: Secondary | ICD-10-CM | POA: Diagnosis not present

## 2019-08-23 DIAGNOSIS — M25631 Stiffness of right wrist, not elsewhere classified: Secondary | ICD-10-CM | POA: Diagnosis not present

## 2019-08-23 DIAGNOSIS — M79645 Pain in left finger(s): Secondary | ICD-10-CM | POA: Diagnosis not present

## 2019-09-06 DIAGNOSIS — Z6833 Body mass index (BMI) 33.0-33.9, adult: Secondary | ICD-10-CM | POA: Diagnosis not present

## 2019-09-06 DIAGNOSIS — W57XXXA Bitten or stung by nonvenomous insect and other nonvenomous arthropods, initial encounter: Secondary | ICD-10-CM | POA: Diagnosis not present

## 2019-09-06 DIAGNOSIS — S30861A Insect bite (nonvenomous) of abdominal wall, initial encounter: Secondary | ICD-10-CM | POA: Diagnosis not present

## 2019-09-26 ENCOUNTER — Ambulatory Visit: Payer: BC Managed Care – PPO | Admitting: Family Medicine

## 2019-10-02 NOTE — Progress Notes (Deleted)
Assessment & Plan:  ***  No follow-ups on file.  Hendricks Limes, MSN, APRN, FNP-C [facility]  Subjective:    Patient ID: Brendan Stone, male    DOB: 04-Jan-1966, 54 y.o.   MRN: [id number]  Patient Care Team: Loman Brooklyn, FNP as PCP - General (Family Medicine)   Chief Complaint: No chief complaint on file.   HPI: Brendan Stone is a 54 y.o. male presenting on 10/06/2019 for No chief complaint on file.  Diabetes: Patient presents for follow up of diabetes. Current symptoms include: paresthesia of the feet. Known diabetic complications: nephropathy and peripheral neuropathy. Medication compliance: ***. Current diet: {diet habits:16563}. Current exercise: {exercise types:16438}. Home blood sugar records: {dm home sugars:14018}. Is he  on ACE inhibitor or angiotensin II receptor blocker? Yes. Is he on a statin? Yes.   Lab Results  Component Value Date   HGBA1C 9.3 (H) 06/28/2019   HGBA1C 11.8 (H) 03/10/2019   Lab Results  Component Value Date   LDLCALC 118 (H) 03/10/2019   CREATININE 0.93 06/28/2019      New complaints: ***  Social history:  Relevant past medical, surgical, family and social history reviewed and updated as indicated. Interim medical history since our last visit reviewed.  Allergies and medications reviewed and updated.  DATA REVIEWED: CHART IN EPIC  ROS: Negative unless specifically indicated above in HPI.    Current Outpatient Medications:  .  atorvastatin (LIPITOR) 10 MG tablet, Take 1 tablet (10 mg total) by mouth daily., Disp: 30 tablet, Rfl: 2 .  blood glucose meter kit and supplies KIT, Dispense based on patient and insurance preference. Use up to four times daily as directed for DM type 2., Disp: 1 each, Rfl: 0 .  calcium-vitamin D (OSCAL WITH D) 500-200 MG-UNIT tablet, Take 1 tablet by mouth., Disp: , Rfl:  .  CINNAMON PO, Take by mouth., Disp: , Rfl:  .  glimepiride (AMARYL) 4 MG tablet, Take 1 tablet (4 mg total) by  mouth daily., Disp: 90 tablet, Rfl: 0 .  Lancets (ACCU-CHEK SOFT TOUCH) lancets, 1 each by Misc.(Non-Drug; Combo Route) route 2 times daily., Disp: , Rfl:  .  Lancets (ONETOUCH DELICA PLUS [id number]) MISC, , Disp: , Rfl:  .  lisinopril (ZESTRIL) 2.5 MG tablet, Take 1 tablet (2.5 mg total) by mouth daily., Disp: 30 tablet, Rfl: 2 .  metFORMIN (GLUCOPHAGE) 500 MG tablet, Take 2 tablets (1,000 mg total) by mouth 2 (two) times daily with a meal., Disp: 360 tablet, Rfl: 0 .  ONETOUCH VERIO test strip, , Disp: , Rfl:  .  sitaGLIPtin (JANUVIA) 25 MG tablet, Take 1 tablet (25 mg total) by mouth daily., Disp: 30 tablet, Rfl: 2   Allergies  Allergen Reactions  . Tape Rash   Past Medical History:  Diagnosis Date  . Back pain   . Diabetes mellitus without complication ([place])   . Hyperlipidemia   . Pneumonia   . Vitamin D deficiency     Past Surgical History:  Procedure Laterality Date  . HAND SURGERY     rt  . REPAIR KNEE LIGAMENT     rt  . ROTATOR CUFF REPAIR     rt    Social History   Socioeconomic History  . Marital status: Legally Separated    Spouse name: Not on file  . Number of children: Not on file  . Years of education: Not on file  . Highest education level: Not on file  Occupational History  .  Not on file  Tobacco Use  . Smoking status: Never Smoker  . Smokeless tobacco: Never Used  Substance and Sexual Activity  . Alcohol use: Yes    Comment: occas  . Drug use: No  . Sexual activity: Not on file  Other Topics Concern  . Not on file  Social History Narrative  . Not on file   Social Determinants of Health   Financial Resource Strain:   . Difficulty of Paying Living Expenses:   Food Insecurity:   . Worried About Charity fundraiser in the Last Year:   . Arboriculturist in the Last Year:   Transportation Needs:   . Film/video editor (Medical):   Marland Kitchen Lack of Transportation (Non-Medical):   Physical Activity:   . Days of Exercise per Week:   . Minutes of  Exercise per Session:   Stress:   . Feeling of Stress :   Social Connections:   . Frequency of Communication with Friends and Family:   . Frequency of Social Gatherings with Friends and Family:   . Attends Religious Services:   . Active Member of Clubs or Organizations:   . Attends Archivist Meetings:   Marland Kitchen Marital Status:   Intimate Partner Violence:   . Fear of Current or Ex-Partner:   . Emotionally Abused:   Marland Kitchen Physically Abused:   . Sexually Abused:         Objective:    There were no vitals taken for this visit.  Wt Readings from Last 3 Encounters:  06/28/19 225 lb (102.1 kg)  03/10/19 216 lb (98 kg)  11/01/18 200 lb (90.7 kg)    Physical Exam  No results found for: TSH Lab Results  Component Value Date   WBC 6.3 03/10/2019   HGB 14.7 03/10/2019   HCT 44.2 03/10/2019   MCV 86 03/10/2019   PLT 234 03/10/2019   Lab Results  Component Value Date   NA 140 06/28/2019   K 4.4 06/28/2019   CO2 25 06/28/2019   GLUCOSE 269 (H) 06/28/2019   BUN 12 06/28/2019   CREATININE 0.93 06/28/2019   BILITOT 0.2 06/28/2019   ALKPHOS 70 06/28/2019   AST 20 06/28/2019   ALT 37 06/28/2019   PROT 7.3 06/28/2019   ALBUMIN 4.7 06/28/2019   CALCIUM 9.7 06/28/2019   ANIONGAP 11 09/15/2017   Lab Results  Component Value Date   CHOL 184 03/10/2019   Lab Results  Component Value Date   HDL 31 (L) 03/10/2019   Lab Results  Component Value Date   LDLCALC 118 (H) 03/10/2019   Lab Results  Component Value Date   TRIG 199 (H) 03/10/2019   Lab Results  Component Value Date   CHOLHDL 5.9 (H) 03/10/2019   Lab Results  Component Value Date   HGBA1C 9.3 (H) 06/28/2019

## 2019-10-06 ENCOUNTER — Ambulatory Visit: Payer: BC Managed Care – PPO | Admitting: Family Medicine

## 2019-10-06 ENCOUNTER — Encounter: Payer: Self-pay | Admitting: Family Medicine

## 2019-10-06 DIAGNOSIS — E119 Type 2 diabetes mellitus without complications: Secondary | ICD-10-CM

## 2019-10-06 DIAGNOSIS — E782 Mixed hyperlipidemia: Secondary | ICD-10-CM

## 2019-10-06 DIAGNOSIS — E559 Vitamin D deficiency, unspecified: Secondary | ICD-10-CM

## 2019-10-30 ENCOUNTER — Other Ambulatory Visit: Payer: Self-pay | Admitting: Family Medicine

## 2019-10-30 DIAGNOSIS — E782 Mixed hyperlipidemia: Secondary | ICD-10-CM

## 2019-10-30 DIAGNOSIS — E119 Type 2 diabetes mellitus without complications: Secondary | ICD-10-CM

## 2019-11-08 DIAGNOSIS — L03113 Cellulitis of right upper limb: Secondary | ICD-10-CM | POA: Diagnosis not present

## 2019-11-08 DIAGNOSIS — Z6835 Body mass index (BMI) 35.0-35.9, adult: Secondary | ICD-10-CM | POA: Diagnosis not present

## 2019-11-24 ENCOUNTER — Other Ambulatory Visit: Payer: Self-pay

## 2019-11-24 ENCOUNTER — Ambulatory Visit (INDEPENDENT_AMBULATORY_CARE_PROVIDER_SITE_OTHER): Payer: BC Managed Care – PPO | Admitting: Family Medicine

## 2019-11-24 ENCOUNTER — Encounter: Payer: Self-pay | Admitting: Family Medicine

## 2019-11-24 VITALS — BP 137/74 | HR 78 | Temp 98.1°F | Ht 68.0 in | Wt 231.8 lb

## 2019-11-24 DIAGNOSIS — E782 Mixed hyperlipidemia: Secondary | ICD-10-CM

## 2019-11-24 DIAGNOSIS — E119 Type 2 diabetes mellitus without complications: Secondary | ICD-10-CM

## 2019-11-24 DIAGNOSIS — L03113 Cellulitis of right upper limb: Secondary | ICD-10-CM | POA: Diagnosis not present

## 2019-11-24 DIAGNOSIS — E559 Vitamin D deficiency, unspecified: Secondary | ICD-10-CM

## 2019-11-24 LAB — BAYER DCA HB A1C WAIVED: HB A1C (BAYER DCA - WAIVED): 9.8 % — ABNORMAL HIGH (ref <7.0)

## 2019-11-24 MED ORDER — OZEMPIC (0.25 OR 0.5 MG/DOSE) 2 MG/1.5ML ~~LOC~~ SOPN: 0.5000 mg | PEN_INJECTOR | SUBCUTANEOUS | 1 refills | Status: DC

## 2019-11-24 MED ORDER — ATORVASTATIN CALCIUM 10 MG PO TABS: 10.0000 mg | ORAL_TABLET | Freq: Every day | ORAL | 1 refills | Status: DC

## 2019-11-24 MED ORDER — GLIMEPIRIDE 4 MG PO TABS: 4.0000 mg | ORAL_TABLET | Freq: Every day | ORAL | 1 refills | Status: DC

## 2019-11-24 MED ORDER — METFORMIN HCL 500 MG PO TABS: ORAL_TABLET | ORAL | 1 refills | Status: DC

## 2019-11-24 MED ORDER — SITAGLIPTIN PHOSPHATE 25 MG PO TABS: 25.0000 mg | ORAL_TABLET | Freq: Every day | ORAL | 1 refills | Status: DC

## 2019-11-24 MED ORDER — LISINOPRIL 2.5 MG PO TABS: 2.5000 mg | ORAL_TABLET | Freq: Every day | ORAL | 1 refills | Status: DC

## 2019-11-24 NOTE — Progress Notes (Signed)
Assessment & Plan:  1. Diabetes mellitus without complication ([place]) Lab Results  Component Value Date   HGBA1C 9.8 (H) 11/24/2019   HGBA1C 9.3 (H) 06/28/2019   HGBA1C 11.8 (H) 03/10/2019  - Diabetes is at goal of A1c < 7. - Medications: Ozempic started today.  - Patient is currently taking a statin. Patient is taking an ACE-inhibitor/ARB.  - Last foot exam: 06/28/2019 - Last diabetic eye exam: 10/12/2016 - Urine Microalbumin/Creat Ratio: 03/10/2019 - Instruction/counseling given: reminded to get eye exam, discussed diet and provided printed educational material - Bayer DCA Hb A1c Waived - glimepiride (AMARYL) 4 MG tablet; Take 1 tablet (4 mg total) by mouth daily.  Dispense: 90 tablet; Refill: 1 - metFORMIN (GLUCOPHAGE) 500 MG tablet; TAKE 2 TABLETS BY MOUTH TWICE DAILY WITH A MEAL  Dispense: 360 tablet; Refill: 1 - lisinopril (ZESTRIL) 2.5 MG tablet; Take 1 tablet (2.5 mg total) by mouth daily.  Dispense: 90 tablet; Refill: 1 - sitaGLIPtin (JANUVIA) 25 MG tablet; Take 1 tablet (25 mg total) by mouth daily.  Dispense: 90 tablet; Refill: 1 - atorvastatin (LIPITOR) 10 MG tablet; Take 1 tablet (10 mg total) by mouth daily.  Dispense: 90 tablet; Refill: 1 - Lipid panel - CMP14+EGFR - Semaglutide,0.25 or 0.5MG/DOS, (OZEMPIC, 0.25 OR 0.5 MG/DOSE,) 2 MG/1.5ML SOPN; Inject 0.375 mLs (0.5 mg total) into the skin once a week.  Dispense: 4 pen; Refill: 1  2. Cellulitis of right forearm - Improving. Complete Doxycycline. - CMP14+EGFR  3. Mixed hyperlipidemia - atorvastatin (LIPITOR) 10 MG tablet; Take 1 tablet (10 mg total) by mouth daily.  Dispense: 90 tablet; Refill: 1 - Lipid panel - CMP14+EGFR  4. Vitamin D deficiency - VITAMIN D 25 Hydroxy (Vit-D Deficiency, Fractures)   Return in about 3 months (around 02/24/2020) for DM.  Hendricks Limes, MSN, APRN, FNP-C [facility]  Subjective:    Patient ID: Brendan Stone, male    DOB: September 10, 1965, 54 y.o.   MRN:  [id number]  Patient Care Team: Loman Brooklyn, FNP as PCP - General (Family Medicine)   Chief Complaint:  Chief Complaint  Patient presents with  . Diabetes    check up of chronic medical conditions  . Follow-up    Patient went to urgent care 6/16 for spider bite on right arm.  Patient states it has gotten better.    HPI: Brendan Stone is a 54 y.o. male presenting on 11/24/2019 for Diabetes (check up of chronic medical conditions) and Follow-up (Patient went to urgent care 6/16 for spider bite on right arm.  Patient states it has gotten better.)  Diabetes: Patient presents for follow up of diabetes. Current symptoms include: paresthesia of the feet. Known diabetic complications: nephropathy. Medication compliance: taking glimepiride daily, missed evening dose of metformin most of the time, missed Januvia most of the time because he takes it in the evening. Is he  on ACE inhibitor or angiotensin II receptor blocker? Yes. Is he on a statin? Yes.   Lab Results  Component Value Date   HGBA1C 9.8 (H) 11/24/2019   HGBA1C 9.3 (H) 06/28/2019   HGBA1C 11.8 (H) 03/10/2019   Lab Results  Component Value Date   LDLCALC 118 (H) 03/10/2019   CREATININE 0.93 06/28/2019     New complaints: Patient reports he got bit by a brown recluse spider and is currently on treatment with Doxycycline. The area has improved significantly per his report.    Social history:  Relevant past medical, surgical, family and  social history reviewed and updated as indicated. Interim medical history since our last visit reviewed.  Allergies and medications reviewed and updated.  DATA REVIEWED: CHART IN EPIC  ROS: Negative unless specifically indicated above in HPI.    Current Outpatient Medications:  .  atorvastatin (LIPITOR) 10 MG tablet, Take 1 tablet by mouth once daily, Disp: 30 tablet, Rfl: 0 .  blood glucose meter kit and supplies KIT, Dispense based on patient and insurance preference. Use up to four  times daily as directed for DM type 2., Disp: 1 each, Rfl: 0 .  calcium-vitamin D (OSCAL WITH D) 500-200 MG-UNIT tablet, Take 1 tablet by mouth., Disp: , Rfl:  .  CINNAMON PO, Take by mouth., Disp: , Rfl:  .  glimepiride (AMARYL) 4 MG tablet, Take 1 tablet by mouth once daily, Disp: 90 tablet, Rfl: 0 .  JANUVIA 25 MG tablet, Take 1 tablet by mouth once daily, Disp: 30 tablet, Rfl: 0 .  Lancets (ACCU-CHEK SOFT TOUCH) lancets, 1 each by Misc.(Non-Drug; Combo Route) route 2 times daily., Disp: , Rfl:  .  Lancets (ONETOUCH DELICA PLUS [id number]) MISC, , Disp: , Rfl:  .  lisinopril (ZESTRIL) 2.5 MG tablet, Take 1 tablet (2.5 mg total) by mouth daily., Disp: 30 tablet, Rfl: 2 .  metFORMIN (GLUCOPHAGE) 500 MG tablet, TAKE 2 TABLETS BY MOUTH TWICE DAILY WITH A MEAL, Disp: 360 tablet, Rfl: 0 .  ONETOUCH VERIO test strip, , Disp: , Rfl:    Allergies  Allergen Reactions  . Tape Rash   Past Medical History:  Diagnosis Date  . Back pain   . Diabetes mellitus without complication ([place])   . Hyperlipidemia   . Pneumonia   . Vitamin D deficiency     Past Surgical History:  Procedure Laterality Date  . HAND SURGERY     rt  . REPAIR KNEE LIGAMENT     rt  . ROTATOR CUFF REPAIR     rt    Social History   Socioeconomic History  . Marital status: Legally Separated    Spouse name: Not on file  . Number of children: Not on file  . Years of education: Not on file  . Highest education level: Not on file  Occupational History  . Not on file  Tobacco Use  . Smoking status: Never Smoker  . Smokeless tobacco: Never Used  Substance and Sexual Activity  . Alcohol use: Yes    Comment: occas  . Drug use: No  . Sexual activity: Not on file  Other Topics Concern  . Not on file  Social History Narrative  . Not on file   Social Determinants of Health   Financial Resource Strain:   . Difficulty of Paying Living Expenses:   Food Insecurity:   . Worried About Charity fundraiser in the Last Year:    . Arboriculturist in the Last Year:   Transportation Needs:   . Film/video editor (Medical):   Marland Kitchen Lack of Transportation (Non-Medical):   Physical Activity:   . Days of Exercise per Week:   . Minutes of Exercise per Session:   Stress:   . Feeling of Stress :   Social Connections:   . Frequency of Communication with Friends and Family:   . Frequency of Social Gatherings with Friends and Family:   . Attends Religious Services:   . Active Member of Clubs or Organizations:   . Attends Archivist Meetings:   Marland Kitchen Marital Status:  Intimate Partner Violence:   . Fear of Current or Ex-Partner:   . Emotionally Abused:   Marland Kitchen Physically Abused:   . Sexually Abused:         Objective:    BP 137/74   Pulse 78   Temp 98.1 F (36.7 C) (Temporal)   Ht '5\' 8"'  (1.727 m)   Wt 231 lb 12.8 oz (105.1 kg)   SpO2 97%   BMI 35.25 kg/m   Wt Readings from Last 3 Encounters:  11/24/19 231 lb 12.8 oz (105.1 kg)  06/28/19 225 lb (102.1 kg)  03/10/19 216 lb (98 kg)    Physical Exam Vitals reviewed.  Constitutional:      General: He is not in acute distress.    Appearance: Normal appearance. He is obese. He is not ill-appearing, toxic-appearing or diaphoretic.  HENT:     Head: Normocephalic and atraumatic.  Eyes:     General: No scleral icterus.       Right eye: No discharge.        Left eye: No discharge.     Conjunctiva/sclera: Conjunctivae normal.  Cardiovascular:     Rate and Rhythm: Normal rate and regular rhythm.     Heart sounds: Normal heart sounds. No murmur heard.  No friction rub. No gallop.   Pulmonary:     Effort: Pulmonary effort is normal. No respiratory distress.     Breath sounds: Normal breath sounds. No stridor. No wheezing, rhonchi or rales.  Musculoskeletal:        General: Normal range of motion.     Cervical back: Normal range of motion.  Skin:    General: Skin is warm and dry.  Neurological:     Mental Status: He is alert and oriented to person,  place, and time. Mental status is at baseline.  Psychiatric:        Mood and Affect: Mood normal.        Behavior: Behavior normal.        Thought Content: Thought content normal.        Judgment: Judgment normal.     No results found for: TSH Lab Results  Component Value Date   WBC 6.3 03/10/2019   HGB 14.7 03/10/2019   HCT 44.2 03/10/2019   MCV 86 03/10/2019   PLT 234 03/10/2019   Lab Results  Component Value Date   NA 140 06/28/2019   K 4.4 06/28/2019   CO2 25 06/28/2019   GLUCOSE 269 (H) 06/28/2019   BUN 12 06/28/2019   CREATININE 0.93 06/28/2019   BILITOT 0.2 06/28/2019   ALKPHOS 70 06/28/2019   AST 20 06/28/2019   ALT 37 06/28/2019   PROT 7.3 06/28/2019   ALBUMIN 4.7 06/28/2019   CALCIUM 9.7 06/28/2019   ANIONGAP 11 09/15/2017   Lab Results  Component Value Date   CHOL 184 03/10/2019   Lab Results  Component Value Date   HDL 31 (L) 03/10/2019   Lab Results  Component Value Date   LDLCALC 118 (H) 03/10/2019   Lab Results  Component Value Date   TRIG 199 (H) 03/10/2019   Lab Results  Component Value Date   CHOLHDL 5.9 (H) 03/10/2019   Lab Results  Component Value Date   HGBA1C 9.3 (H) 06/28/2019

## 2019-11-24 NOTE — Patient Instructions (Signed)

## 2019-11-25 LAB — LIPID PANEL
Chol/HDL Ratio: 3.8 ratio (ref 0.0–5.0)
Cholesterol, Total: 118 mg/dL (ref 100–199)
HDL: 31 mg/dL — ABNORMAL LOW (ref >39)
LDL Chol Calc (NIH): 64 mg/dL (ref 0–99)
Triglycerides: 131 mg/dL (ref 0–149)
VLDL Cholesterol Cal: 23 mg/dL (ref 5–40)

## 2019-11-25 LAB — CMP14+EGFR
ALT: 30 IU/L (ref 0–44)
AST: 19 IU/L (ref 0–40)
Albumin/Globulin Ratio: 1.5 (ref 1.2–2.2)
Albumin: 4.1 g/dL (ref 3.8–4.9)
Alkaline Phosphatase: 74 IU/L (ref 48–121)
BUN/Creatinine Ratio: 18 (ref 9–20)
BUN: 14 mg/dL (ref 6–24)
Bilirubin Total: <0.2 mg/dL (ref 0.0–1.2)
CO2: 23 mmol/L (ref 20–29)
Calcium: 8.7 mg/dL (ref 8.7–10.2)
Chloride: 103 mmol/L (ref 96–106)
Creatinine, Ser: 0.78 mg/dL (ref 0.76–1.27)
GFR calc Af Amer: 118 mL/min/{1.73_m2} (ref >59)
GFR calc non Af Amer: 102 mL/min/{1.73_m2} (ref >59)
Globulin, Total: 2.7 g/dL (ref 1.5–4.5)
Glucose: 229 mg/dL — ABNORMAL HIGH (ref 65–99)
Potassium: 4.1 mmol/L (ref 3.5–5.2)
Sodium: 139 mmol/L (ref 134–144)
Total Protein: 6.8 g/dL (ref 6.0–8.5)

## 2019-11-25 LAB — VITAMIN D 25 HYDROXY (VIT D DEFICIENCY, FRACTURES): Vit D, 25-Hydroxy: 33.8 ng/mL (ref 30.0–100.0)

## 2019-11-26 ENCOUNTER — Encounter: Payer: Self-pay | Admitting: Family Medicine

## 2019-11-28 ENCOUNTER — Other Ambulatory Visit: Payer: Self-pay

## 2019-11-28 ENCOUNTER — Ambulatory Visit: Payer: BC Managed Care – PPO | Admitting: Pharmacist

## 2019-11-28 DIAGNOSIS — E119 Type 2 diabetes mellitus without complications: Secondary | ICD-10-CM | POA: Diagnosis not present

## 2019-11-28 NOTE — Progress Notes (Signed)
    11/28/2019 Name: RACER QUAM MRN: [id number] DOB: 1965-06-19   S:  27 yoM presents for diabetes evaluation, education, and management Patient was referred and last seen by Primary Care Provider on 11/24/19.  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 . Current diabetes medications include: glimepiride, metformin, januvia . Current hypertension medications include: n/a (will start ACEi) Goal 130/80 . Current hyperlipidemia medications include: atorvastatin   Patient denies hypoglycemic events.   Patient reported dietary habits: Eats 2-3 meals/day Discussed meal planning options and Plate method for healthy eating . Avoid sugary drinks and desserts . Incorporate balanced protein, non starchy veggies, 1 serving of carbohydrate with each meal . Increase water intake . Increase physical activity as able  Patient-reported exercise habits: n/a  Patient denies nocturia (nighttime urination).  Patient denies neuropathy (nerve pain).  Patient denies visual changes.  Patient reports self foot exams.    O:  Lab Results  Component Value Date   HGBA1C 9.8 (H) 11/24/2019    Lipid Panel     Component Value Date/Time   CHOL 118 11/24/2019 1503   TRIG 131 11/24/2019 1503   HDL 31 (L) 11/24/2019 1503   CHOLHDL 3.8 11/24/2019 1503   LDLCALC 64 11/24/2019 1503    Home fasting blood sugars: 150-170s  2 hour post-meal/random blood sugars: n/a     A/P:  Diabetes T2DM currently uncontrolled.  Patient is motivated to control diabetes as he has has family struggle with this disease state. Patient is adherent with medication. Control is suboptimal due to diet/lifestyle.  -Started GLP-1 Ozempic (generic name: semglutide)  . Instructions: Inject 0.25 mg into the skin weekly for 4 weeks, then increase to 0.5mg  weekly thereafter (as tolerated) . Assisted patient with injection in office . Assisted with copay card and  set up   -STOP januiva, STOP glimepiride  -START ACEi (lisinopril)  Discussed kidney/heart benefits with patient  -Continue atorvastin  -Extensively discussed pathophysiology of diabetes, recommended lifestyle interventions, dietary effects on blood sugar control  -Counseled on s/sx of and management of hypoglycemia  -Next A1C anticipated 3-6 months.  Written patient instructions provided.  Total time in face to face counseling 30 minutes.   Follow up Pharmacist on 12/19/19 Follow upPCP Clinic Visit ON 03/18/20.   [name], PharmD, BCPS Clinical Pharmacist, [facility]  II Phone [phone]

## 2019-12-04 ENCOUNTER — Encounter: Payer: Self-pay | Admitting: Pharmacist

## 2019-12-05 ENCOUNTER — Telehealth: Payer: Self-pay | Admitting: Family Medicine

## 2019-12-05 NOTE — Telephone Encounter (Signed)
Pt called requesting to speak with Raynelle Fanning regarding Ozempic Pen. Explained to pt that Raynelle Fanning would not be back in office until Friday. Pt requested to speak with nurse.

## 2019-12-05 NOTE — Telephone Encounter (Signed)
Called pt back and he says he was able to figure it out and doesn't need anything else.

## 2019-12-19 ENCOUNTER — Ambulatory Visit (INDEPENDENT_AMBULATORY_CARE_PROVIDER_SITE_OTHER): Payer: BC Managed Care – PPO | Admitting: Pharmacist

## 2019-12-19 DIAGNOSIS — E1122 Type 2 diabetes mellitus with diabetic chronic kidney disease: Secondary | ICD-10-CM

## 2019-12-19 DIAGNOSIS — N183 Chronic kidney disease, stage 3 unspecified: Secondary | ICD-10-CM | POA: Diagnosis not present

## 2019-12-19 NOTE — Progress Notes (Signed)
° ° °  12/19/2019 Name: Brendan Stone MRN: [id number] DOB: 04/26/1966   S:  54yOM presents for diabetes evaluation, education, and management. Patient states he feels better overall and notices a difference with the new medications Patient was referred and last seen by Primary Care Provider on 11/24/19.  I connected with  Brendan Stone on 12/19/19 by a video enabled telemedicine application and verified that I am speaking with the correct person using two identifiers.   I discussed the limitations of evaluation and management by telemedicine. The patient expressed understanding and agreed to proceed.  Insurance coverage/medication affordability: BCBS commercial  Patient reports adherence with medications.  Current diabetes medications include: glimepiride, metformin, Ozempic  Current hypertension medications include: n/a (will start ACEi) Goal 130/80  Current hyperlipidemia medications include: atorvastatin   Patient denies hypoglycemic events.   Patient reported dietary habits: Eats 2-3 meals/day Discussed meal planning options and Plate method for healthy eating  Avoid sugary drinks and desserts  Incorporate balanced protein, non starchy veggies, 1 serving of carbohydrate with each meal  Increase water intake  Increase physical activity as able  Patient denies nocturia (nighttime urination).  Patient denies neuropathy (nerve pain).  Patient denies visual changes.  Patient reports self foot exams.    O:  Lab Results  Component Value Date   HGBA1C 9.8 (H) 11/24/2019    Lipid Panel     Component Value Date/Time   CHOL 118 11/24/2019 1503   TRIG 131 11/24/2019 1503   HDL 31 (L) 11/24/2019 1503   CHOLHDL 3.8 11/24/2019 1503   LDLCALC 64 11/24/2019 1503    Home fasting blood sugars: not checking   2 hour post-meal/random blood sugars: not checking    A/P:  Diabetes T2DM currently uncontrolled.  Patient is adherent with medication. Patient states that he feels  so much better overall, however is struggling with a bit of nausea.  -CONTINUE GLP-1 Ozempic (generic name: semglutide)   Instructions: Inject 0.25 mg into the skin weekly; patient still having nausea, therefore we will not increase dose at this time.  Assisted with copay card and set up at last visit  -STOP januiva, STOP glimepiride  -CONTINUE metformin  -START ACEi (lisinopril)             Discussed kidney/heart benefits with patient  -Continue atorvastin  -Extensively discussed pathophysiology of diabetes, recommended lifestyle interventions, dietary effects on blood sugar control  -Counseled on s/sx of and management of hypoglycemia  -Next A1C anticipated 3-6 months   Total time in counseling 30 minutes.   Follow up PCP Clinic Visit ON 02/27/20.   [name], PharmD, BCPS Clinical Pharmacist, [facility]  II Phone [phone]

## 2019-12-29 ENCOUNTER — Telehealth: Payer: Self-pay | Admitting: Family Medicine

## 2019-12-29 NOTE — Telephone Encounter (Signed)
I spoke with pt and he states he got the 2nd COVID shot 11/17/19 and also started Ozempic around the same time. He has been having 3-4 loose stools everyday and nauseated after each meal. Please advise

## 2019-12-30 NOTE — Telephone Encounter (Signed)
Does he know when the diarrhea started? Looks like he didn't start Ozempic until July and when he followed up with Raynelle Fanning he only reported nausea. Is he still on the 0.25 dosage?

## 2020-01-01 NOTE — Telephone Encounter (Signed)
Pt returned missed call from [facility] and says that he started having nausea and diarrhea towards the end of June after he got his 2nd COVID shot and was started on Ozempic. Pt says he is still taking the 0.25 dosage and says today/tomorrow is his last day taking.

## 2020-01-02 NOTE — Telephone Encounter (Signed)
Pt states he is having nausea and vomiting every time he eats anything and he thinks it is from the COVID vaccine and states "I swear to GOD I wished I never took the vaccine" as he believes this is what is causing it. He says he is miserable and needs something. He is taking Pepto just to be able to go to work and it isn't helping.

## 2020-01-03 MED ORDER — ONDANSETRON 4 MG PO TBDP
4.0000 mg | ORAL_TABLET | Freq: Three times a day (TID) | ORAL | 0 refills | Status: DC | PRN
Start: 2020-01-03 — End: 2020-01-11

## 2020-01-03 NOTE — Telephone Encounter (Signed)
No answer, mailbox full

## 2020-01-03 NOTE — Telephone Encounter (Signed)
Pt aware medication  sent to pharmacy 

## 2020-01-03 NOTE — Telephone Encounter (Signed)
Last dose of Ozempic given yesterday (0.25mg ) F/u to call patient next week to determine next steps May transition to Rybelsus

## 2020-01-03 NOTE — Telephone Encounter (Signed)
I sent Zofran to take as needed.

## 2020-01-10 ENCOUNTER — Telehealth: Payer: Self-pay | Admitting: Pharmacist

## 2020-01-10 ENCOUNTER — Telehealth: Payer: Self-pay | Admitting: Family Medicine

## 2020-01-10 NOTE — Telephone Encounter (Signed)
Patient still having GI side effects from Ozempic (notices nausea when eating, not all day, but occurs everyday) His dose was due today Will hold Ozempic starting today (last dose given 7 days ago) Restart Januvia with hopes to start Rybelsus in the coming weeks. F/u in 1 week

## 2020-01-10 NOTE — Telephone Encounter (Signed)
Pt asked to talk to Julie please call back 

## 2020-01-11 MED ORDER — ONDANSETRON 8 MG PO TBDP
8.0000 mg | ORAL_TABLET | Freq: Three times a day (TID) | ORAL | 0 refills | Status: DC | PRN
Start: 2020-01-11 — End: 2020-06-13

## 2020-01-11 MED ORDER — PROMETHAZINE HCL 12.5 MG PO TABS: 12.5000 mg | ORAL_TABLET | Freq: Four times a day (QID) | ORAL | 0 refills | Status: DC | PRN

## 2020-01-11 NOTE — Telephone Encounter (Signed)
Pt called stating that he is still having nausea and diarrhea from either Ozempic or Covid Vaccine. Wants to know what can be done to help him with these symptoms.

## 2020-01-11 NOTE — Telephone Encounter (Signed)
Patient spoke to [facility] yesterday and would like for her to call him back concerning the conversation yesterday.

## 2020-01-11 NOTE — Telephone Encounter (Signed)
Still having nausea, discussed with PCP Recommend famotidine, will call in promethazine to see if this will help vs zofran Higher dose zofran called in for patient if he can't tolerate the drowsiness from promethazine

## 2020-01-16 DIAGNOSIS — E113413 Type 2 diabetes mellitus with severe nonproliferative diabetic retinopathy with macular edema, bilateral: Secondary | ICD-10-CM | POA: Diagnosis not present

## 2020-01-16 DIAGNOSIS — H35033 Hypertensive retinopathy, bilateral: Secondary | ICD-10-CM | POA: Diagnosis not present

## 2020-01-16 DIAGNOSIS — H3563 Retinal hemorrhage, bilateral: Secondary | ICD-10-CM | POA: Diagnosis not present

## 2020-01-16 DIAGNOSIS — H43823 Vitreomacular adhesion, bilateral: Secondary | ICD-10-CM | POA: Diagnosis not present

## 2020-01-17 ENCOUNTER — Ambulatory Visit (INDEPENDENT_AMBULATORY_CARE_PROVIDER_SITE_OTHER): Payer: BC Managed Care – PPO | Admitting: Pharmacist

## 2020-01-17 ENCOUNTER — Other Ambulatory Visit: Payer: Self-pay

## 2020-01-17 DIAGNOSIS — E119 Type 2 diabetes mellitus without complications: Secondary | ICD-10-CM

## 2020-01-17 MED ORDER — FREESTYLE LIBRE 2 READER DEVI: 0 refills | Status: AC

## 2020-01-17 MED ORDER — FREESTYLE LIBRE 2 SENSOR MISC: 11 refills | Status: AC

## 2020-01-17 NOTE — Progress Notes (Signed)
    01/17/2020 Name: Brendan Stone MRN: [id number] DOB: Jun 09, 1965   S: 54yOM presents for diabetes evaluation, education, and management. Patient states he feels better overall and notices a difference with the new medications Patient was referred and last seen by Primary Care Provider on 11/24/19.  I connected with KEAHI MCCARNEY on 12/19/19 by a video enabled telemedicine application and verified that I am speaking with the correct person using two identifiers.  I discussed the limitations of evaluation and management by telemedicine. The patient expressed understanding and agreed to proceed.  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  Current diabetes medications include:glimepiride, metformin, Ozempic  Current hypertension medications include:n/a (will start ACEi) Goal 130/80  Current hyperlipidemia medications include:atorvastatin   Patient denies hypoglycemic events.   Patient reported dietary habits: Eats 2-3 meals/day Discussed meal planning options and Plate method for healthy eating  Avoid sugary drinks and desserts  Incorporate balanced protein, non starchy veggies, 1 serving of carbohydrate with each meal  Increase water intake  Increase physical activity as able  Patient denies nocturia (nighttime urination).  Patient denies neuropathy (nerve pain).  Patient denies visual changes.  Patient reports self foot exams.    O:  Lab Results  Component Value Date   HGBA1C 9.8 (H) 11/24/2019     Lipid Panel     Component Value Date/Time   CHOL 118 11/24/2019 1503   TRIG 131 11/24/2019 1503   HDL 31 (L) 11/24/2019 1503   CHOLHDL 3.8 11/24/2019 1503   LDLCALC 64 11/24/2019 1503     Home fasting blood sugars: not checking, does not enjoy pricking finger (will try to get patient CGM)  2 hour post-meal/random blood sugars: n/a.     A/P:  Diabetes T2DM currently uncontrolled.    -Nausea stopped on Sunday, patient has discontinued Ozempic (has been off Ozempic for 2 weeks)  -He continues to have diarrhea--2-3 times daily  -Recommend patient increase fiber to improve GI/diarrhea-->start fiber supplement  -imodium OTC if needed  -ozempic should be out of his system in the next couple of weeks  -Patient has lost approximately 12lbs in the past month  -Will plan to start SGLT2 after symptoms resolve, Encourage patient to keep [place] posted on symptoms  -Will attempt to see if Josephine Igo is covered for patient to be able to test blood sugar   -Extensively discussed pathophysiology of diabetes, recommended lifestyle interventions, dietary effects on blood sugar control  -Counseled on s/sx of and management of hypoglycemia  -Next A1C anticipated 02/2020. Written patient instructions provided.  Total time in face to face counseling 30 minutes.   Follow up PCP Clinic Visit ON 02/2020.    [name], PharmD, BCPS Clinical Pharmacist, [facility]  II Phone [phone]

## 2020-02-27 ENCOUNTER — Ambulatory Visit: Payer: BC Managed Care – PPO | Admitting: Family Medicine

## 2020-02-27 ENCOUNTER — Other Ambulatory Visit: Payer: Self-pay

## 2020-02-27 ENCOUNTER — Encounter: Payer: Self-pay | Admitting: Family Medicine

## 2020-02-27 VITALS — BP 144/82 | HR 78 | Temp 97.4°F | Ht 68.0 in | Wt 221.8 lb

## 2020-02-27 DIAGNOSIS — M79604 Pain in right leg: Secondary | ICD-10-CM | POA: Diagnosis not present

## 2020-02-27 DIAGNOSIS — E1165 Type 2 diabetes mellitus with hyperglycemia: Secondary | ICD-10-CM

## 2020-02-27 DIAGNOSIS — M79605 Pain in left leg: Secondary | ICD-10-CM | POA: Diagnosis not present

## 2020-02-27 DIAGNOSIS — E1121 Type 2 diabetes mellitus with diabetic nephropathy: Secondary | ICD-10-CM | POA: Diagnosis not present

## 2020-02-27 DIAGNOSIS — R5383 Other fatigue: Secondary | ICD-10-CM

## 2020-02-27 DIAGNOSIS — E669 Obesity, unspecified: Secondary | ICD-10-CM | POA: Diagnosis not present

## 2020-02-27 DIAGNOSIS — E119 Type 2 diabetes mellitus without complications: Secondary | ICD-10-CM | POA: Diagnosis not present

## 2020-02-27 LAB — BAYER DCA HB A1C WAIVED: HB A1C (BAYER DCA - WAIVED): 9.2 % — ABNORMAL HIGH (ref <7.0)

## 2020-02-27 MED ORDER — DAPAGLIFLOZIN PROPANEDIOL 10 MG PO TABS: 10.0000 mg | ORAL_TABLET | Freq: Every day | ORAL | 2 refills | Status: DC

## 2020-02-27 MED ORDER — MELOXICAM 7.5 MG PO TABS: 7.5000 mg | ORAL_TABLET | Freq: Every day | ORAL | 2 refills | Status: DC

## 2020-02-27 NOTE — Progress Notes (Signed)
Assessment & Plan:  1. Type 2 diabetes mellitus with hyperglycemia, without long-term current use of insulin (HCC) Lab Results  Component Value Date   HGBA1C 9.2 (H) 02/27/2020   HGBA1C 9.8 (H) 11/24/2019   HGBA1C 9.3 (H) 06/28/2019  - Diabetes is not at goal of A1c < 7. - Medications: continue current medications, start [place] glucose monitoring: continue [employer]. - Patient is currently taking a statin. Patient is taking an ACE-inhibitor/ARB.  - Last foot exam: 06/28/2019 - Last diabetic eye exam: 2018 - Urine Microalbumin/Creat Ratio: 03/09/2020 - collected today.  - Instruction/counseling given: reminded to get eye exam, discussed the need for weight loss and discussed diet - Bayer DCA Hb A1c Waived - sitaGLIPtin (JANUVIA) 25 MG tablet; Take 25 mg by mouth daily. - Microalbumin / creatinine urine ratio - dapagliflozin propanediol (FARXIGA) 10 MG TABS tablet; Take 1 tablet (10 mg total) by mouth daily before breakfast. Start with 5 mg daily for the first week.  Dispense: 30 tablet; Refill: 2 - CMP14+EGFR  2. Diabetic nephropathy associated with type 2 diabetes mellitus ([place]) - Patient is on an ACE-inhibitor. We are working on better control of his diabetes.  - sitaGLIPtin (JANUVIA) 25 MG tablet; Take 25 mg by mouth daily.  3. Obesity (BMI 30.0-34.9) - Continue diet and exercise. Patient has lost 10 lbs in the past 3 months.  4. Pain in both lower extremities - Discussed we cannot just say he needs disability or early retirement without a workup and trying to treat his pain. Labs today. Starting meloxicam daily.  - Magnesium - meloxicam (MOBIC) 7.5 MG tablet; Take 1 tablet (7.5 mg total) by mouth daily.  Dispense: 30 tablet; Refill: 2  5. Exhaustion - Anemia Profile B   Return in about 6 weeks (around 04/09/2020) for with Brendan Stone for DM; 3 months with me for DM.  Brendan Limes, MSN, APRN, FNP-C [facility]  Subjective:    Patient ID:  Brendan Stone, male    DOB: 02-Dec-1965, 54 y.o.   MRN: [id number]  Patient Care Team: Brendan Brooklyn, FNP as PCP - General (Family Medicine) Brendan Stone, [facility] (Pharmacist)   Chief Complaint:  Chief Complaint  Patient presents with  . Diabetes    3 month check up of chronic medical conditions    HPI: Brendan Stone is a 54 y.o. male presenting on 02/27/2020 for Diabetes (3 month check up of chronic medical conditions)  Diabetes: Patient presents for follow up of diabetes. Current symptoms include: paresthesia of the feet. Known diabetic complications: nephropathy and peripheral neuropathy. Medication compliance: yes - Ozempic was stopped due to GI side effects which have resolved. Current diet: in general, a "healthy" diet  . Current exercise: walking at work. Is he  on ACE inhibitor or angiotensin II receptor blocker? Yes. Is he on a statin? Yes.   Lab Results  Component Value Date   HGBA1C 9.2 (H) 02/27/2020   HGBA1C 9.8 (H) 11/24/2019   HGBA1C 9.3 (H) 06/28/2019   Lab Results  Component Value Date   LDLCALC 64 11/24/2019   CREATININE 0.78 11/24/2019     New complaints: Patient reports he "has more bad days than good" and it decreases his performance at work. When asked him to describe further he explains that his legs hurt on a daily basis. He describes the pain as aching and rates it 7/10 regularly. His legs are worse after a night at work, but also bother him on  his days off because he is always doing things. He is wanting to know about how to apply for disability or an early retirement. He does not take any medication for the pain. He has not had a previous work-up.   He also reports he just feels exhausted all the time.    Social history:  Relevant past medical, surgical, family and social history reviewed and updated as indicated. Interim medical history since our last visit reviewed.  Allergies and medications reviewed and updated.  DATA REVIEWED: CHART IN  EPIC  ROS: Negative unless specifically indicated above in HPI.    Current Outpatient Medications:  .  atorvastatin (LIPITOR) 10 MG tablet, Take 1 tablet (10 mg total) by mouth daily., Disp: 90 tablet, Rfl: 1 .  blood glucose meter kit and supplies KIT, Dispense based on patient and insurance preference. Use up to four times daily as directed for DM type 2., Disp: 1 each, Rfl: 0 .  calcium-vitamin D (OSCAL WITH D) 500-200 MG-UNIT tablet, Take 1 tablet by mouth., Disp: , Rfl:  .  CINNAMON PO, Take by mouth., Disp: , Rfl:  .  Continuous Blood Gluc Receiver (FREESTYLE LIBRE 2 READER) DEVI, Use to test blood sugar up to 6 times daily. DX: E11.65, Disp: 1 each, Rfl: 0 .  Continuous Blood Gluc Sensor (FREESTYLE LIBRE 2 SENSOR) MISC, Use to test blood sugar up to 6 times daily. DX: E11.65, Disp: 2 each, Rfl: 11 .  Lancets (ACCU-CHEK SOFT TOUCH) lancets, 1 each by Misc.(Non-Drug; Combo Route) route 2 times daily., Disp: , Rfl:  .  Lancets (ONETOUCH DELICA PLUS WLNLGX21J) MISC, , Disp: , Rfl:  .  lisinopril (ZESTRIL) 2.5 MG tablet, Take 1 tablet (2.5 mg total) by mouth daily., Disp: 90 tablet, Rfl: 1 .  metFORMIN (GLUCOPHAGE) 500 MG tablet, TAKE 2 TABLETS BY MOUTH TWICE DAILY WITH A MEAL, Disp: 360 tablet, Rfl: 1 .  ondansetron (ZOFRAN ODT) 8 MG disintegrating tablet, Take 1 tablet (8 mg total) by mouth every 8 (eight) hours as needed for nausea or vomiting., Disp: 20 tablet, Rfl: 0 .  ONETOUCH VERIO test strip, , Disp: , Rfl:    Allergies  Allergen Reactions  . Tape Rash   Past Medical History:  Diagnosis Date  . Back pain   . Diabetes mellitus without complication ([place])   . Hyperlipidemia   . Pneumonia   . Vitamin D deficiency     Past Surgical History:  Procedure Laterality Date  . HAND SURGERY     rt  . REPAIR KNEE LIGAMENT     rt  . ROTATOR CUFF REPAIR     rt    Social History   Socioeconomic History  . Marital status: Legally Separated    Spouse name: Not on file  . Number  of children: Not on file  . Years of education: Not on file  . Highest education level: Not on file  Occupational History  . Not on file  Tobacco Use  . Smoking status: Never Smoker  . Smokeless tobacco: Never Used  Substance and Sexual Activity  . Alcohol use: Yes    Comment: occas  . Drug use: No  . Sexual activity: Not on file  Other Topics Concern  . Not on file  Social History Narrative  . Not on file   Social Determinants of Health   Financial Resource Strain:   . Difficulty of Paying Living Expenses: Not on file  Food Insecurity:   . Worried About Running  Out of Food in the Last Year: Not on file  . Ran Out of Food in the Last Year: Not on file  Transportation Needs:   . Lack of Transportation (Medical): Not on file  . Lack of Transportation (Non-Medical): Not on file  Physical Activity:   . Days of Exercise per Week: Not on file  . Minutes of Exercise per Session: Not on file  Stress:   . Feeling of Stress : Not on file  Social Connections:   . Frequency of Communication with Friends and Family: Not on file  . Frequency of Social Gatherings with Friends and Family: Not on file  . Attends Religious Services: Not on file  . Active Member of Clubs or Organizations: Not on file  . Attends Archivist Meetings: Not on file  . Marital Status: Not on file  Intimate Partner Violence:   . Fear of Current or Ex-Partner: Not on file  . Emotionally Abused: Not on file  . Physically Abused: Not on file  . Sexually Abused: Not on file        Objective:    BP (!) 144/82   Pulse 78   Temp (!) 97.4 F (36.3 C) (Temporal)   Ht _0  (1.727 m)   Wt 221 lb 12.8 oz (100.6 kg)   SpO2 97%   BMI 33.72 kg/m   Wt Readings from Last 3 Encounters:  02/27/20 221 lb 12.8 oz (100.6 kg)  11/24/19 231 lb 12.8 oz (105.1 kg)  06/28/19 225 lb (102.1 kg)    Physical Exam Vitals reviewed.  Constitutional:      General: He is not in acute distress.    Appearance:  Normal appearance. He is obese. He is not ill-appearing, toxic-appearing or diaphoretic.  HENT:     Head: Normocephalic and atraumatic.  Eyes:     General: No scleral icterus.       Right eye: No discharge.        Left eye: No discharge.     Conjunctiva/sclera: Conjunctivae normal.  Cardiovascular:     Rate and Rhythm: Normal rate and regular rhythm.     Heart sounds: Normal heart sounds. No murmur heard.  No friction rub. No gallop.   Pulmonary:     Effort: Pulmonary effort is normal. No respiratory distress.     Breath sounds: Normal breath sounds. No stridor. No wheezing, rhonchi or rales.  Musculoskeletal:        General: Normal range of motion.     Cervical back: Normal range of motion.  Skin:    General: Skin is warm and dry.  Neurological:     Mental Status: He is alert and oriented to person, place, and time. Mental status is at baseline.  Psychiatric:        Mood and Affect: Mood normal.        Behavior: Behavior normal.        Thought Content: Thought content normal.        Judgment: Judgment normal.     No results found for: TSH Lab Results  Component Value Date   WBC 6.3 03/10/2019   HGB 14.7 03/10/2019   HCT 44.2 03/10/2019   MCV 86 03/10/2019   PLT 234 03/10/2019   Lab Results  Component Value Date   NA 139 11/24/2019   K 4.1 11/24/2019   CO2 23 11/24/2019   GLUCOSE 229 (H) 11/24/2019   BUN 14 11/24/2019   CREATININE 0.78 11/24/2019   BILITOT <0.2  11/24/2019   ALKPHOS 74 11/24/2019   AST 19 11/24/2019   ALT 30 11/24/2019   PROT 6.8 11/24/2019   ALBUMIN 4.1 11/24/2019   CALCIUM 8.7 11/24/2019   ANIONGAP 11 09/15/2017   Lab Results  Component Value Date   CHOL 118 11/24/2019   Lab Results  Component Value Date   HDL 31 (L) 11/24/2019   Lab Results  Component Value Date   LDLCALC 64 11/24/2019   Lab Results  Component Value Date   TRIG 131 11/24/2019   Lab Results  Component Value Date   CHOLHDL 3.8 11/24/2019   Lab Results   Component Value Date   HGBA1C 9.8 (H) 11/24/2019

## 2020-02-28 LAB — CMP14+EGFR
ALT: 28 IU/L (ref 0–44)
AST: 19 IU/L (ref 0–40)
Albumin/Globulin Ratio: 1.8 (ref 1.2–2.2)
Albumin: 4.4 g/dL (ref 3.8–4.9)
Alkaline Phosphatase: 79 IU/L (ref 44–121)
BUN/Creatinine Ratio: 13 (ref 9–20)
BUN: 10 mg/dL (ref 6–24)
Bilirubin Total: <0.2 mg/dL (ref 0.0–1.2)
CO2: 22 mmol/L (ref 20–29)
Calcium: 8.7 mg/dL (ref 8.7–10.2)
Chloride: 101 mmol/L (ref 96–106)
Creatinine, Ser: 0.79 mg/dL (ref 0.76–1.27)
GFR calc Af Amer: 118 mL/min/{1.73_m2} (ref >59)
GFR calc non Af Amer: 102 mL/min/{1.73_m2} (ref >59)
Globulin, Total: 2.5 g/dL (ref 1.5–4.5)
Glucose: 248 mg/dL — ABNORMAL HIGH (ref 65–99)
Potassium: 4 mmol/L (ref 3.5–5.2)
Sodium: 137 mmol/L (ref 134–144)
Total Protein: 6.9 g/dL (ref 6.0–8.5)

## 2020-02-28 LAB — ANEMIA PROFILE B
Basophils Absolute: 0.1 10*3/uL (ref 0.0–0.2)
Basos: 2 %
EOS (ABSOLUTE): 0.3 10*3/uL (ref 0.0–0.4)
Eos: 5 %
Ferritin: 90 ng/mL (ref 30–400)
Folate: 13.9 ng/mL (ref >3.0)
Hematocrit: 42.6 % (ref 37.5–51.0)
Hemoglobin: 14.2 g/dL (ref 13.0–17.7)
Immature Grans (Abs): 0.1 10*3/uL (ref 0.0–0.1)
Immature Granulocytes: 1 %
Iron Saturation: 13 % — ABNORMAL LOW (ref 15–55)
Iron: 40 ug/dL (ref 38–169)
Lymphocytes Absolute: 2.1 10*3/uL (ref 0.7–3.1)
Lymphs: 32 %
MCH: 29.3 pg (ref 26.6–33.0)
MCHC: 33.3 g/dL (ref 31.5–35.7)
MCV: 88 fL (ref 79–97)
Monocytes Absolute: 0.6 10*3/uL (ref 0.1–0.9)
Monocytes: 8 %
Neutrophils Absolute: 3.5 10*3/uL (ref 1.4–7.0)
Neutrophils: 52 %
Platelets: 222 10*3/uL (ref 150–450)
RBC: 4.84 x10E6/uL (ref 4.14–5.80)
RDW: 13.1 % (ref 11.6–15.4)
Retic Ct Pct: 1.9 % (ref 0.6–2.6)
Total Iron Binding Capacity: 314 ug/dL (ref 250–450)
UIBC: 274 ug/dL (ref 111–343)
Vitamin B-12: 524 pg/mL (ref 232–1245)
WBC: 6.6 10*3/uL (ref 3.4–10.8)

## 2020-02-28 LAB — MAGNESIUM: Magnesium: 1.9 mg/dL (ref 1.6–2.3)

## 2020-02-28 LAB — MICROALBUMIN / CREATININE URINE RATIO
Creatinine, Urine: 141.6 mg/dL
Microalb/Creat Ratio: 55 mg/g creat — ABNORMAL HIGH (ref 0–29)
Microalbumin, Urine: 77.4 ug/mL

## 2020-03-01 ENCOUNTER — Encounter: Payer: Self-pay | Admitting: Family Medicine

## 2020-03-01 DIAGNOSIS — E669 Obesity, unspecified: Secondary | ICD-10-CM | POA: Insufficient documentation

## 2020-03-01 DIAGNOSIS — E1121 Type 2 diabetes mellitus with diabetic nephropathy: Secondary | ICD-10-CM | POA: Insufficient documentation

## 2020-03-06 ENCOUNTER — Telehealth: Payer: Self-pay

## 2020-03-06 NOTE — Telephone Encounter (Signed)
Can you help with assistance?

## 2020-03-06 NOTE — Telephone Encounter (Signed)
°  Called and spoke with patient states it was the new med for DM that he could not do the ozempic. Looks like it might be the [place]

## 2020-03-06 NOTE — Telephone Encounter (Signed)
CALLED [facility] COPAY CARD CALLED IN; INSURANCE ON FILE WAS WRONG $0 COPAY (YAY!) PATIENT CAN CALL [facility] TO SEE WHEN IT WILL BE READY USUALLY TAKES THEM SEVERAL HOURS TO PROCESS

## 2020-03-06 NOTE — Telephone Encounter (Signed)
Pt called stating that he was recently prescribed a new medication (doesn't know what the name of it is) but when he went to pharmacy to pick it up, they told him it was going to cost him over $500 for it and pt cant afford that. Wants to know if generic med can be sent that would be much cheaper?

## 2020-03-07 NOTE — Telephone Encounter (Signed)
Patient aware and verbalized understanding. °

## 2020-03-13 DIAGNOSIS — M25561 Pain in right knee: Secondary | ICD-10-CM | POA: Diagnosis not present

## 2020-03-13 DIAGNOSIS — M25551 Pain in right hip: Secondary | ICD-10-CM | POA: Diagnosis not present

## 2020-03-13 DIAGNOSIS — S83401A Sprain of unspecified collateral ligament of right knee, initial encounter: Secondary | ICD-10-CM | POA: Diagnosis not present

## 2020-04-09 ENCOUNTER — Other Ambulatory Visit: Payer: Self-pay

## 2020-04-09 ENCOUNTER — Ambulatory Visit (INDEPENDENT_AMBULATORY_CARE_PROVIDER_SITE_OTHER): Payer: BC Managed Care – PPO | Admitting: Pharmacist

## 2020-04-09 DIAGNOSIS — E782 Mixed hyperlipidemia: Secondary | ICD-10-CM | POA: Diagnosis not present

## 2020-04-09 DIAGNOSIS — E119 Type 2 diabetes mellitus without complications: Secondary | ICD-10-CM

## 2020-04-09 MED ORDER — LISINOPRIL 2.5 MG PO TABS: 2.5000 mg | ORAL_TABLET | Freq: Every day | ORAL | 1 refills | Status: DC

## 2020-04-09 MED ORDER — ATORVASTATIN CALCIUM 10 MG PO TABS: 10.0000 mg | ORAL_TABLET | Freq: Every day | ORAL | 1 refills | Status: DC

## 2020-04-09 NOTE — Progress Notes (Signed)
    04/09/2020 Name: Brendan Stone MRN: [id number] DOB: 08-09-1965   S:  56 yoM Presents for diabetes evaluation, education, and management Patient was referred and last seen by Primary Care Provider on 02/27/20.  Patient has recently been out of work for 1 month due to hip sprain injury.  This has likely brought on stress and compounded his comorbidities.  Of note, patient has tried and failed Ozempic due to GI distress.  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 . Current diabetes medications include: metformin, farxiga o Tried and failed Ozempic x2-3 month trial w/ zofran-->patient states he felt his best on this medication, however it negatively impacted his work/life . Current hypertension medications include: lisinopril Goal 130/80 . Current hyperlipidemia medications include: atorvastatin   Patient denies hypoglycemic events.   Patient reported dietary habits: Eats 2-3 meals/day Discussed meal planning options and Plate method for healthy eating . Avoid sugary drinks and desserts . Incorporate balanced protein, non starchy veggies, 1 serving of carbohydrate with each meal . Increase water intake . Increase physical activity as able  Patient-reported exercise habits: unable due to current injury  O:  Lab Results  Component Value Date   HGBA1C 9.2 (H) 02/27/2020   Lipid Panel     Component Value Date/Time   CHOL 118 11/24/2019 1503   TRIG 131 11/24/2019 1503   HDL 31 (L) 11/24/2019 1503   CHOLHDL 3.8 11/24/2019 1503   LDLCALC 64 11/24/2019 1503    Home fasting blood sugars: patient not checking, encouraged him to bring libre in so I can assist with set up  2 hour post-meal/random blood sugars: 338 today in office (s/p lunch x1hr)   A/P:  Diabetes T2DM currently uncontrolled. Patient is adherent with medication. Control is suboptimal due to diet/lifestyle.  He is not interested in starting insulin at  this time, but may be open to it later.  -Start GLP1 Rybelsus 3mg  daily to see if he can tolerate it from a GI standpoint.  Counseled patient on administration and side effects   Samples given #20 tabs--> LOT#[id number], EXP 6/23  -STOP Januvia   -Continue metformin and Farxiga   -Extensively discussed pathophysiology of diabetes, recommended lifestyle interventions, dietary effects on blood sugar control  -Counseled on s/sx of and management of hypoglycemia  -Next A1C anticipated 3 months.  Written patient instructions provided.  Total time in face to face counseling 30 minutes.   Follow up Pharmacist/PCP Clinic Visit ON January .   February, PharmD, BCPS Clinical Pharmacist, [facility]  II Phone [phone]

## 2020-04-10 DIAGNOSIS — M5430 Sciatica, unspecified side: Secondary | ICD-10-CM | POA: Diagnosis not present

## 2020-05-06 ENCOUNTER — Encounter: Payer: Self-pay | Admitting: *Deleted

## 2020-05-07 DIAGNOSIS — E113513 Type 2 diabetes mellitus with proliferative diabetic retinopathy with macular edema, bilateral: Secondary | ICD-10-CM | POA: Diagnosis not present

## 2020-05-07 DIAGNOSIS — H3563 Retinal hemorrhage, bilateral: Secondary | ICD-10-CM | POA: Diagnosis not present

## 2020-05-07 DIAGNOSIS — H35033 Hypertensive retinopathy, bilateral: Secondary | ICD-10-CM | POA: Diagnosis not present

## 2020-05-10 DIAGNOSIS — Z23 Encounter for immunization: Secondary | ICD-10-CM | POA: Diagnosis not present

## 2020-05-28 DIAGNOSIS — E113511 Type 2 diabetes mellitus with proliferative diabetic retinopathy with macular edema, right eye: Secondary | ICD-10-CM | POA: Diagnosis not present

## 2020-05-30 ENCOUNTER — Ambulatory Visit: Payer: BC Managed Care – PPO | Admitting: Family Medicine

## 2020-06-04 DIAGNOSIS — E113512 Type 2 diabetes mellitus with proliferative diabetic retinopathy with macular edema, left eye: Secondary | ICD-10-CM | POA: Diagnosis not present

## 2020-06-11 DIAGNOSIS — E113511 Type 2 diabetes mellitus with proliferative diabetic retinopathy with macular edema, right eye: Secondary | ICD-10-CM | POA: Diagnosis not present

## 2020-06-13 ENCOUNTER — Ambulatory Visit: Payer: BC Managed Care – PPO | Admitting: Family Medicine

## 2020-06-13 ENCOUNTER — Other Ambulatory Visit: Payer: Self-pay

## 2020-06-13 ENCOUNTER — Encounter: Payer: Self-pay | Admitting: Family Medicine

## 2020-06-13 VITALS — BP 137/86 | HR 86 | Temp 98.1°F | Ht 68.0 in | Wt 215.4 lb

## 2020-06-13 DIAGNOSIS — E669 Obesity, unspecified: Secondary | ICD-10-CM

## 2020-06-13 DIAGNOSIS — E1165 Type 2 diabetes mellitus with hyperglycemia: Secondary | ICD-10-CM

## 2020-06-13 DIAGNOSIS — E782 Mixed hyperlipidemia: Secondary | ICD-10-CM | POA: Diagnosis not present

## 2020-06-13 LAB — BAYER DCA HB A1C WAIVED: HB A1C (BAYER DCA - WAIVED): 12.8 % — ABNORMAL HIGH (ref <7.0)

## 2020-06-13 MED ORDER — RYBELSUS 7 MG PO TABS: 7.0000 mg | ORAL_TABLET | Freq: Every day | ORAL | 2 refills | Status: DC

## 2020-06-13 MED ORDER — METFORMIN HCL ER 500 MG PO TB24: 1000.0000 mg | ORAL_TABLET | Freq: Two times a day (BID) | ORAL | 1 refills | Status: DC

## 2020-06-13 NOTE — Progress Notes (Signed)
Assessment & Plan:  1. Type 2 diabetes mellitus with hyperglycemia, without long-term current use of insulin (HCC) Lab Results  Component Value Date   HGBA1C 12.8 (H) 06/13/2020   HGBA1C 9.2 (H) 02/27/2020   HGBA1C 9.8 (H) 11/24/2019   - Diabetes is not at goal of A1c < 7. - Medications: continue Farxiga, metformin changed to XR, patient given 30-day sample of Rybelsus 3 mg and a prescription was sent in for Rybelsus 7 mg for him to start after the initial 30 days - Patient is currently taking a statin. Patient is taking an ACE-inhibitor/ARB.  - Last foot exam: 06/28/2019 - Last diabetic eye exam: 10/12/2016 - Urine Microalbumin/Creat Ratio: 02/27/2020 - Instruction/counseling given: reminded to get eye exam, reminded to bring medications to each visit, discussed diet and provided printed educational material - Lipid panel - CBC with Differential/Platelet - CMP14+EGFR - Bayer DCA Hb A1c Waived - metFORMIN (GLUCOPHAGE-XR) 500 MG 24 hr tablet; Take 2 tablets (1,000 mg total) by mouth 2 (two) times daily with a meal.  Dispense: 360 tablet; Refill: 1 - Semaglutide (RYBELSUS) 7 MG TABS; Take 7 mg by mouth daily. Start after 30 days of 3 mg.  Dispense: 30 tablet; Refill: 2  2. Mixed hyperlipidemia - Labs to assess.  Continue statin. - Lipid panel - CBC with Differential/Platelet - CMP14+EGFR  3. Obesity (BMI 30.0-34.9) - Patient has lost 6 additional pounds in the past 3 months.   Return in about 3 months (around 09/11/2020) for DM.  Brendan Limes, MSN, APRN, FNP-C [facility]  Subjective:    Patient ID: Brendan Stone, male    DOB: 12-30-1965, 55 y.o.   MRN: [id number]  Patient Care Team: Loman Brooklyn, FNP as PCP - General (Family Medicine) Lavera Guise, [facility] (Pharmacist)   Chief Complaint:  Chief Complaint  Patient presents with  . Diabetes    3 month follow up of medical conditions     HPI: Brendan Stone is a 55 y.o. male presenting on  06/13/2020 for Diabetes (3 month follow up of medical conditions/)  Diabetes: Patient presents for follow up of diabetes. Current symptoms include: hyperglycemia and paresthesia of the feet. Known diabetic complications: nephropathy and peripheral neuropathy. Medication compliance: patient reports he takes [place] daily, he misses metformin sometimes due to diarrhea, and he never got a prescription for Rybelsus after he finished the 30 day sample box. Current diet: in general, an "unhealthy" diet. Current exercise: walking at work. Is he  on ACE inhibitor or angiotensin II receptor blocker? Yes. Is he on a statin? Yes.   Lab Results  Component Value Date   HGBA1C 12.8 (H) 06/13/2020   HGBA1C 9.2 (H) 02/27/2020   HGBA1C 9.8 (H) 11/24/2019   Lab Results  Component Value Date   LDLCALC 64 11/24/2019   CREATININE 0.79 02/27/2020     New complaints: None  Social history:  Relevant past medical, surgical, family and social history reviewed and updated as indicated. Interim medical history since our last visit reviewed.  Allergies and medications reviewed and updated.  DATA REVIEWED: CHART IN EPIC  ROS: Negative unless specifically indicated above in HPI.    Current Outpatient Medications:  .  atorvastatin (LIPITOR) 10 MG tablet, Take 1 tablet (10 mg total) by mouth daily., Disp: 90 tablet, Rfl: 1 .  calcium-vitamin D (OSCAL WITH D) 500-200 MG-UNIT tablet, Take 1 tablet by mouth., Disp: , Rfl:  .  CINNAMON PO, Take by mouth., Disp: , Rfl:  .  Continuous Blood Gluc Receiver (FREESTYLE LIBRE 2 READER) DEVI, Use to test blood sugar up to 6 times daily. DX: E11.65, Disp: 1 each, Rfl: 0 .  Continuous Blood Gluc Sensor (FREESTYLE LIBRE 2 SENSOR) MISC, Use to test blood sugar up to 6 times daily. DX: E11.65, Disp: 2 each, Rfl: 11 .  dapagliflozin propanediol (FARXIGA) 10 MG TABS tablet, Take 1 tablet (10 mg total) by mouth daily before breakfast. Start with 5 mg daily for the first week., Disp:  30 tablet, Rfl: 2 .  Lancets (ACCU-CHEK SOFT TOUCH) lancets, 1 each by Misc.(Non-Drug; Combo Route) route 2 times daily., Disp: , Rfl:  .  Lancets (ONETOUCH DELICA PLUS MBTDHR41U) MISC, , Disp: , Rfl:  .  lisinopril (ZESTRIL) 2.5 MG tablet, Take 1 tablet (2.5 mg total) by mouth daily., Disp: 90 tablet, Rfl: 1 .  metFORMIN (GLUCOPHAGE) 500 MG tablet, TAKE 2 TABLETS BY MOUTH TWICE DAILY WITH A MEAL, Disp: 360 tablet, Rfl: 1 .  ONETOUCH VERIO test strip, , Disp: , Rfl:  .  Semaglutide (RYBELSUS) 3 MG TABS, Take 3 mg by mouth daily., Disp: , Rfl:    Allergies  Allergen Reactions  . Tape Rash   Past Medical History:  Diagnosis Date  . Back pain   . Diabetes mellitus without complication ([place])   . Diabetic nephropathy ([place])   . Hyperlipidemia   . Pneumonia   . Vitamin D deficiency     Past Surgical History:  Procedure Laterality Date  . EYE SURGERY    . HAND SURGERY     rt  . REPAIR KNEE LIGAMENT     rt  . ROTATOR CUFF REPAIR     rt    Social History   Socioeconomic History  . Marital status: Legally Separated    Spouse name: Not on file  . Number of children: Not on file  . Years of education: Not on file  . Highest education level: Not on file  Occupational History  . Not on file  Tobacco Use  . Smoking status: Never Smoker  . Smokeless tobacco: Never Used  Substance and Sexual Activity  . Alcohol use: Yes    Comment: occas  . Drug use: No  . Sexual activity: Not on file  Other Topics Concern  . Not on file  Social History Narrative  . Not on file   Social Determinants of Health   Financial Resource Strain: Not on file  Food Insecurity: Not on file  Transportation Needs: Not on file  Physical Activity: Not on file  Stress: Not on file  Social Connections: Not on file  Intimate Partner Violence: Not on file        Objective:    BP 137/86   Pulse 86   Temp 98.1 F (36.7 C) (Temporal)   Ht '5\' 8"'  (1.727 m)   Wt 215 lb 6.4 oz (97.7 kg)   SpO2 95%    BMI 32.75 kg/m   Wt Readings from Last 3 Encounters:  06/13/20 215 lb 6.4 oz (97.7 kg)  02/27/20 221 lb 12.8 oz (100.6 kg)  11/24/19 231 lb 12.8 oz (105.1 kg)    Physical Exam Vitals reviewed.  Constitutional:      General: He is not in acute distress.    Appearance: Normal appearance. He is obese. He is not ill-appearing, toxic-appearing or diaphoretic.  HENT:     Head: Normocephalic and atraumatic.  Eyes:     General: No scleral icterus.  Right eye: No discharge.        Left eye: No discharge.     Conjunctiva/sclera: Conjunctivae normal.  Cardiovascular:     Rate and Rhythm: Normal rate and regular rhythm.     Heart sounds: Normal heart sounds. No murmur heard. No friction rub. No gallop.   Pulmonary:     Effort: Pulmonary effort is normal. No respiratory distress.     Breath sounds: Normal breath sounds. No stridor. No wheezing, rhonchi or rales.  Musculoskeletal:        General: Normal range of motion.     Cervical back: Normal range of motion.  Skin:    General: Skin is warm and dry.  Neurological:     Mental Status: He is alert and oriented to person, place, and time. Mental status is at baseline.  Psychiatric:        Mood and Affect: Mood normal.        Behavior: Behavior normal.        Thought Content: Thought content normal.        Judgment: Judgment normal.     No results found for: TSH Lab Results  Component Value Date   WBC 6.6 02/27/2020   HGB 14.2 02/27/2020   HCT 42.6 02/27/2020   MCV 88 02/27/2020   PLT 222 02/27/2020   Lab Results  Component Value Date   NA 137 02/27/2020   K 4.0 02/27/2020   CO2 22 02/27/2020   GLUCOSE 248 (H) 02/27/2020   BUN 10 02/27/2020   CREATININE 0.79 02/27/2020   BILITOT <0.2 02/27/2020   ALKPHOS 79 02/27/2020   AST 19 02/27/2020   ALT 28 02/27/2020   PROT 6.9 02/27/2020   ALBUMIN 4.4 02/27/2020   CALCIUM 8.7 02/27/2020   ANIONGAP 11 09/15/2017   Lab Results  Component Value Date   CHOL 118  11/24/2019   Lab Results  Component Value Date   HDL 31 (L) 11/24/2019   Lab Results  Component Value Date   LDLCALC 64 11/24/2019   Lab Results  Component Value Date   TRIG 131 11/24/2019   Lab Results  Component Value Date   CHOLHDL 3.8 11/24/2019   Lab Results  Component Value Date   HGBA1C 9.2 (H) 02/27/2020

## 2020-06-13 NOTE — Patient Instructions (Signed)
Diabetes Mellitus and Nutrition, Adult When you have diabetes, or diabetes mellitus, it is very important to have healthy eating habits because your blood sugar (glucose) levels are greatly affected by what you eat and drink. Eating healthy foods in the right amounts, at about the same times every day, can help you:  Control your blood glucose.  Lower your risk of heart disease.  Improve your blood pressure.  Reach or maintain a healthy weight. What can affect my meal plan? Every person with diabetes is different, and each person has different needs for a meal plan. Your health care provider may recommend that you work with a dietitian to make a meal plan that is best for you. Your meal plan may vary depending on factors such as:  The calories you need.  The medicines you take.  Your weight.  Your blood glucose, blood pressure, and cholesterol levels.  Your activity level.  Other health conditions you have, such as heart or kidney disease. How do carbohydrates affect me? Carbohydrates, also called carbs, affect your blood glucose level more than any other type of food. Eating carbs naturally raises the amount of glucose in your blood. Carb counting is a method for keeping track of how many carbs you eat. Counting carbs is important to keep your blood glucose at a healthy level, especially if you use insulin or take certain oral diabetes medicines. It is important to know how many carbs you can safely have in each meal. This is different for every person. Your dietitian can help you calculate how many carbs you should have at each meal and for each snack. How does alcohol affect me? Alcohol can cause a sudden decrease in blood glucose (hypoglycemia), especially if you use insulin or take certain oral diabetes medicines. Hypoglycemia can be a life-threatening condition. Symptoms of hypoglycemia, such as sleepiness, dizziness, and confusion, are similar to symptoms of having too much  alcohol.  Do not drink alcohol if: ? Your health care provider tells you not to drink. ? You are pregnant, may be pregnant, or are planning to become pregnant.  If you drink alcohol: ? Do not drink on an empty stomach. ? Limit how much you use to:  0-1 drink a day for women.  0-2 drinks a day for men. ? Be aware of how much alcohol is in your drink. In the [place] one drink equals one 12 oz bottle of beer (355 mL), one 5 oz glass of wine (148 mL), or one 1 oz glass of hard liquor (44 mL). ? Keep yourself hydrated with water, diet soda, or unsweetened iced tea.  Keep in mind that regular soda, juice, and other mixers may contain a lot of sugar and must be counted as carbs. What are tips for following this plan? Reading food labels  Start by checking the serving size on the "Nutrition Facts" label of packaged foods and drinks. The amount of calories, carbs, fats, and other nutrients listed on the label is based on one serving of the item. Many items contain more than one serving per package.  Check the total grams (g) of carbs in one serving. You can calculate the number of servings of carbs in one serving by dividing the total carbs by 15. For example, if a food has 30 g of total carbs per serving, it would be equal to 2 servings of carbs.  Check the number of grams (g) of saturated fats and trans fats in one serving. Choose foods that have   a low amount or none of these fats.  Check the number of milligrams (mg) of salt (sodium) in one serving. Most people should limit total sodium intake to less than 2,300 mg per day.  Always check the nutrition information of foods labeled as "low-fat" or "nonfat." These foods may be higher in added sugar or refined carbs and should be avoided.  Talk to your dietitian to identify your daily goals for nutrients listed on the label. Shopping  Avoid buying canned, pre-made, or processed foods. These foods tend to be high in fat, sodium, and added  sugar.  Shop around the outside edge of the grocery store. This is where you will most often find fresh fruits and vegetables, bulk grains, fresh meats, and fresh dairy. Cooking  Use low-heat cooking methods, such as baking, instead of high-heat cooking methods like deep frying.  Cook using healthy oils, such as olive, canola, or sunflower oil.  Avoid cooking with butter, cream, or high-fat meats. Meal planning  Eat meals and snacks regularly, preferably at the same times every day. Avoid going long periods of time without eating.  Eat foods that are high in fiber, such as fresh fruits, vegetables, beans, and whole grains. Talk with your dietitian about how many servings of carbs you can eat at each meal.  Eat 4-6 oz (112-168 g) of lean protein each day, such as lean meat, chicken, fish, eggs, or tofu. One ounce (oz) of lean protein is equal to: ? 1 oz (28 g) of meat, chicken, or fish. ? 1 egg. ?  cup (62 g) of tofu.  Eat some foods each day that contain healthy fats, such as avocado, nuts, seeds, and fish.   What foods should I eat? Fruits Berries. Apples. Oranges. Peaches. Apricots. Plums. Grapes. Mango. Papaya. Pomegranate. Kiwi. Cherries. Vegetables Lettuce. Spinach. Leafy greens, including kale, chard, collard greens, and mustard greens. Beets. Cauliflower. Cabbage. Broccoli. Carrots. Green beans. Tomatoes. Peppers. Onions. Cucumbers. Brussels sprouts. Grains Whole grains, such as whole-wheat or whole-grain bread, crackers, tortillas, cereal, and pasta. Unsweetened oatmeal. Quinoa. Brown or wild rice. Meats and other proteins Seafood. Poultry without skin. Lean cuts of poultry and beef. Tofu. Nuts. Seeds. Dairy Low-fat or fat-free dairy products such as milk, yogurt, and cheese. The items listed above may not be a complete list of foods and beverages you can eat. Contact a dietitian for more information. What foods should I avoid? Fruits Fruits canned with  syrup. Vegetables Canned vegetables. Frozen vegetables with butter or cream sauce. Grains Refined white flour and flour products such as bread, pasta, snack foods, and cereals. Avoid all processed foods. Meats and other proteins Fatty cuts of meat. Poultry with skin. Breaded or fried meats. Processed meat. Avoid saturated fats. Dairy Full-fat yogurt, cheese, or milk. Beverages Sweetened drinks, such as soda or iced tea. The items listed above may not be a complete list of foods and beverages you should avoid. Contact a dietitian for more information. Questions to ask a health care provider  Do I need to meet with a diabetes educator?  Do I need to meet with a dietitian?  What number can I call if I have questions?  When are the best times to check my blood glucose? Where to find more information:  American Diabetes Association: diabetes.org  Academy of Nutrition and Dietetics: www.eatright.org  National Institute of Diabetes and Digestive and Kidney Diseases: www.niddk.nih.gov  Association of Diabetes Care and Education Specialists: www.diabeteseducator.org Summary  It is important to have healthy eating   habits because your blood sugar (glucose) levels are greatly affected by what you eat and drink.  A healthy meal plan will help you control your blood glucose and maintain a healthy lifestyle.  Your health care provider may recommend that you work with a dietitian to make a meal plan that is best for you.  Keep in mind that carbohydrates (carbs) and alcohol have immediate effects on your blood glucose levels. It is important to count carbs and to use alcohol carefully. This information is not intended to replace advice given to you by your health care provider. Make sure you discuss any questions you have with your health care provider. Document Revised: 04/18/2019 Document Reviewed: 04/18/2019 Elsevier Patient Education  2021 Elsevier Inc.  

## 2020-06-14 LAB — CMP14+EGFR
ALT: 25 IU/L (ref 0–44)
AST: 20 IU/L (ref 0–40)
Albumin/Globulin Ratio: 1.4 (ref 1.2–2.2)
Albumin: 4.1 g/dL (ref 3.8–4.9)
Alkaline Phosphatase: 72 IU/L (ref 44–121)
BUN/Creatinine Ratio: 11 (ref 9–20)
BUN: 10 mg/dL (ref 6–24)
Bilirubin Total: 0.5 mg/dL (ref 0.0–1.2)
CO2: 25 mmol/L (ref 20–29)
Calcium: 8.9 mg/dL (ref 8.7–10.2)
Chloride: 100 mmol/L (ref 96–106)
Creatinine, Ser: 0.91 mg/dL (ref 0.76–1.27)
GFR calc Af Amer: 110 mL/min/{1.73_m2} (ref >59)
GFR calc non Af Amer: 95 mL/min/{1.73_m2} (ref >59)
Globulin, Total: 3 g/dL (ref 1.5–4.5)
Glucose: 259 mg/dL — ABNORMAL HIGH (ref 65–99)
Potassium: 4 mmol/L (ref 3.5–5.2)
Sodium: 137 mmol/L (ref 134–144)
Total Protein: 7.1 g/dL (ref 6.0–8.5)

## 2020-06-14 LAB — CBC WITH DIFFERENTIAL/PLATELET
Basophils Absolute: 0.1 10*3/uL (ref 0.0–0.2)
Basos: 2 %
EOS (ABSOLUTE): 0.3 10*3/uL (ref 0.0–0.4)
Eos: 5 %
Hematocrit: 42.6 % (ref 37.5–51.0)
Hemoglobin: 14.8 g/dL (ref 13.0–17.7)
Immature Grans (Abs): 0 10*3/uL (ref 0.0–0.1)
Immature Granulocytes: 1 %
Lymphocytes Absolute: 2.2 10*3/uL (ref 0.7–3.1)
Lymphs: 37 %
MCH: 29.6 pg (ref 26.6–33.0)
MCHC: 34.7 g/dL (ref 31.5–35.7)
MCV: 85 fL (ref 79–97)
Monocytes Absolute: 0.6 10*3/uL (ref 0.1–0.9)
Monocytes: 9 %
Neutrophils Absolute: 2.8 10*3/uL (ref 1.4–7.0)
Neutrophils: 46 %
Platelets: 211 10*3/uL (ref 150–450)
RBC: 5 x10E6/uL (ref 4.14–5.80)
RDW: 12.8 % (ref 11.6–15.4)
WBC: 6.1 10*3/uL (ref 3.4–10.8)

## 2020-06-14 LAB — LIPID PANEL
Chol/HDL Ratio: 5.9 ratio — ABNORMAL HIGH (ref 0.0–5.0)
Cholesterol, Total: 188 mg/dL (ref 100–199)
HDL: 32 mg/dL — ABNORMAL LOW (ref >39)
LDL Chol Calc (NIH): 124 mg/dL — ABNORMAL HIGH (ref 0–99)
Triglycerides: 177 mg/dL — ABNORMAL HIGH (ref 0–149)
VLDL Cholesterol Cal: 32 mg/dL (ref 5–40)

## 2020-06-18 DIAGNOSIS — E113512 Type 2 diabetes mellitus with proliferative diabetic retinopathy with macular edema, left eye: Secondary | ICD-10-CM | POA: Diagnosis not present

## 2020-09-11 ENCOUNTER — Ambulatory Visit: Payer: BC Managed Care – PPO | Admitting: Family Medicine

## 2020-09-12 ENCOUNTER — Encounter: Payer: Self-pay | Admitting: Family Medicine

## 2020-09-18 ENCOUNTER — Encounter: Payer: Self-pay | Admitting: Family Medicine

## 2020-09-18 ENCOUNTER — Ambulatory Visit: Payer: BC Managed Care – PPO | Admitting: Family Medicine

## 2020-09-18 DIAGNOSIS — U071 COVID-19: Secondary | ICD-10-CM | POA: Diagnosis not present

## 2020-09-18 MED ORDER — DEXAMETHASONE 6 MG PO TABS: 6.0000 mg | ORAL_TABLET | Freq: Every day | ORAL | 0 refills | Status: AC

## 2020-09-18 MED ORDER — ONDANSETRON 4 MG PO TBDP
4.0000 mg | ORAL_TABLET | Freq: Three times a day (TID) | ORAL | 0 refills | Status: DC | PRN
Start: 2020-09-18 — End: 2022-09-15

## 2020-09-18 NOTE — Progress Notes (Signed)
Virtual Visit via Telephone Note  I connected with Brendan Stone on 09/18/20 at 2:56 PM by telephone and verified that I am speaking with the correct person using two identifiers. Brendan Stone is currently located at home and nobody is currently with him during this visit. The provider, Gwenlyn Fudge, FNP is located in their office at time of visit.  I discussed the limitations, risks, security and privacy concerns of performing an evaluation and management service by telephone and the availability of in person appointments. I also discussed with the patient that there may be a patient responsible charge related to this service. The patient expressed understanding and agreed to proceed.  Subjective: PCP: Gwenlyn Fudge, FNP  Chief Complaint  Patient presents with  . URI   Patient complains of cough, headache, runny nose, sneezing, fever and nausea. Onset of symptoms was 1 week ago, gradually improving since that time. He is drinking plenty of fluids. Evaluation to date: tested positive for COVID-19 a week ago. Treatment to date: cough suppressants and Ibuprofen, vitamin C, vitamin D, zinc, and garlic.  He does not smoke.    ROS: Per HPI  Current Outpatient Medications:  .  atorvastatin (LIPITOR) 10 MG tablet, Take 1 tablet (10 mg total) by mouth daily., Disp: 90 tablet, Rfl: 1 .  calcium-vitamin D (OSCAL WITH D) 500-200 MG-UNIT tablet, Take 1 tablet by mouth., Disp: , Rfl:  .  CINNAMON PO, Take by mouth., Disp: , Rfl:  .  Continuous Blood Gluc Receiver (FREESTYLE LIBRE 2 READER) DEVI, Use to test blood sugar up to 6 times daily. DX: E11.65, Disp: 1 each, Rfl: 0 .  Continuous Blood Gluc Sensor (FREESTYLE LIBRE 2 SENSOR) MISC, Use to test blood sugar up to 6 times daily. DX: E11.65, Disp: 2 each, Rfl: 11 .  dapagliflozin propanediol (FARXIGA) 10 MG TABS tablet, Take 1 tablet (10 mg total) by mouth daily before breakfast. Start with 5 mg daily for the first week., Disp: 30 tablet, Rfl:  2 .  Lancets (ACCU-CHEK SOFT TOUCH) lancets, 1 each by Misc.(Non-Drug; Combo Route) route 2 times daily., Disp: , Rfl:  .  Lancets (ONETOUCH DELICA PLUS LANCET33G) MISC, , Disp: , Rfl:  .  lisinopril (ZESTRIL) 2.5 MG tablet, Take 1 tablet (2.5 mg total) by mouth daily., Disp: 90 tablet, Rfl: 1 .  metFORMIN (GLUCOPHAGE-XR) 500 MG 24 hr tablet, Take 2 tablets (1,000 mg total) by mouth 2 (two) times daily with a meal., Disp: 360 tablet, Rfl: 1 .  ONETOUCH VERIO test strip, , Disp: , Rfl:  .  Semaglutide (RYBELSUS) 3 MG TABS, Take 3 mg by mouth daily., Disp: , Rfl:  .  Semaglutide (RYBELSUS) 7 MG TABS, Take 7 mg by mouth daily. Start after 30 days of 3 mg., Disp: 30 tablet, Rfl: 2  Allergies  Allergen Reactions  . Tape Rash   Past Medical History:  Diagnosis Date  . Back pain   . Diabetes mellitus without complication (HCC)   . Diabetic nephropathy (HCC)   . Hyperlipidemia   . Pneumonia   . Vitamin D deficiency     Observations/Objective: A&O  No respiratory distress or wheezing audible over the phone Mood, judgement, and thought processes all WNL  Assessment and Plan: 1. COVID-19 Discussed symptom management.  - ondansetron (ZOFRAN ODT) 4 MG disintegrating tablet; Take 1 tablet (4 mg total) by mouth every 8 (eight) hours as needed for nausea or vomiting.  Dispense: 30 tablet; Refill: 0 - dexamethasone (  DECADRON) 6 MG tablet; Take 1 tablet (6 mg total) by mouth daily for 5 days.  Dispense: 5 tablet; Refill: 0   Follow Up Instructions:  I discussed the assessment and treatment plan with the patient. The patient was provided an opportunity to ask questions and all were answered. The patient agreed with the plan and demonstrated an understanding of the instructions.   The patient was advised to call back or seek an in-person evaluation if the symptoms worsen or if the condition fails to improve as anticipated.  The above assessment and management plan was discussed with the patient.  The patient verbalized understanding of and has agreed to the management plan. Patient is aware to call the clinic if symptoms persist or worsen. Patient is aware when to return to the clinic for a follow-up visit. Patient educated on when it is appropriate to go to the emergency department.   Time call ended: 3:07 PM  I provided 11 minutes of non-face-to-face time during this encounter.  Deliah Boston, MSN, APRN, FNP-C [facility] 09/18/20

## 2020-09-20 ENCOUNTER — Telehealth: Payer: Self-pay

## 2020-09-20 DIAGNOSIS — U071 COVID-19: Secondary | ICD-10-CM

## 2020-09-20 MED ORDER — HYDROCODONE BIT-HOMATROP MBR 5-1.5 MG/5ML PO SOLN
5.0000 mL | Freq: Three times a day (TID) | ORAL | 0 refills | Status: DC | PRN
Start: 2020-09-20 — End: 2020-10-03

## 2020-09-20 NOTE — Telephone Encounter (Signed)
Patient aware.

## 2020-09-20 NOTE — Telephone Encounter (Signed)
Pt called stating that he had an appt with Britney on 4/27 and forgot to ask her if she would prescribe him some cough syrup.   Please send to [facility] in [place] if able.

## 2020-09-20 NOTE — Telephone Encounter (Signed)
Rx sent 

## 2020-09-24 ENCOUNTER — Ambulatory Visit: Payer: BC Managed Care – PPO | Admitting: Family Medicine

## 2020-10-02 ENCOUNTER — Telehealth: Payer: Self-pay

## 2020-10-02 DIAGNOSIS — U071 COVID-19: Secondary | ICD-10-CM

## 2020-10-02 NOTE — Telephone Encounter (Signed)
This was addressed on 09/20/20.

## 2020-10-02 NOTE — Telephone Encounter (Signed)
Attempted to contact patient - NA °

## 2020-10-03 MED ORDER — PROMETHAZINE-DM 6.25-15 MG/5ML PO SYRP: 5.0000 mL | ORAL_SOLUTION | Freq: Four times a day (QID) | ORAL | 0 refills | Status: DC | PRN

## 2020-10-03 NOTE — Telephone Encounter (Signed)
Patient aware.

## 2020-10-03 NOTE — Telephone Encounter (Signed)
Patient states that he finished his cough syrup that was sent in 09/20/20.  Advised patient that we could not send in more cough medication with codeine in it since it is controlled and it was less than a month ago.  Patient has tried OTC and has not helped.  States he was told yesterday that we was sending him in something.  Apologized to patient stating that I am not sure who told him that but it is up to the provider and medication was sent in on 09/20/20.  Asked patient if he would like me to see if tessalon pearls could be sent in.  Patient was rude on the phone. Please advise

## 2020-10-03 NOTE — Telephone Encounter (Signed)
I sent a different cough syrup.

## 2020-10-11 ENCOUNTER — Ambulatory Visit: Payer: BC Managed Care – PPO | Admitting: Family Medicine

## 2020-10-11 ENCOUNTER — Other Ambulatory Visit: Payer: Self-pay

## 2020-10-15 ENCOUNTER — Telehealth: Payer: Self-pay | Admitting: Family Medicine

## 2020-10-15 NOTE — Telephone Encounter (Signed)
Patient had an appointment scheduled on 10/11/2020.  When he checked in for his appointment and was told about a balance he had due to a lapse in insurance he got very upset.  The front staff and clinical nurse manager tried to talk with him and explain that he needed to call his insurance company.  Patient began yelling and cussing at the staff.  He ended up leaving without being seen that day.  I tried to contact him the same day but was unable to reach him.  I was able to reach him today.  He explained he had the problem taking care of through his HR manager.  I explained that he could not treat our staff that way and if he did so in the future he would be discharged from the practice.  Patient verbalized understanding.  Encouraged to reschedule his diabetic follow-up.

## 2020-10-18 DIAGNOSIS — H43823 Vitreomacular adhesion, bilateral: Secondary | ICD-10-CM | POA: Diagnosis not present

## 2020-10-18 DIAGNOSIS — E113513 Type 2 diabetes mellitus with proliferative diabetic retinopathy with macular edema, bilateral: Secondary | ICD-10-CM | POA: Diagnosis not present

## 2020-10-18 DIAGNOSIS — H35033 Hypertensive retinopathy, bilateral: Secondary | ICD-10-CM | POA: Diagnosis not present

## 2021-03-13 DIAGNOSIS — Z23 Encounter for immunization: Secondary | ICD-10-CM | POA: Diagnosis not present

## 2021-07-16 ENCOUNTER — Encounter: Payer: Self-pay | Admitting: Family Medicine

## 2021-07-16 ENCOUNTER — Ambulatory Visit: Payer: BC Managed Care – PPO | Admitting: Family Medicine

## 2022-03-26 ENCOUNTER — Telehealth: Payer: Self-pay | Admitting: *Deleted

## 2022-03-26 NOTE — Telephone Encounter (Signed)
Patient contacted our office with high blood pressure 200/129, headache and high blood sugar 418.  I consulted with Brynda Peon, CMA to see if patient should be seen here or go to ED/urgent care.  She stated patient needs to go to [facility] urgent [facility]. I concurred. I contated patient and strongly advised ED or urgent care

## 2022-09-15 ENCOUNTER — Encounter (HOSPITAL_COMMUNITY): Payer: Self-pay

## 2022-09-15 ENCOUNTER — Other Ambulatory Visit: Payer: Self-pay

## 2022-09-15 ENCOUNTER — Emergency Department (HOSPITAL_COMMUNITY)
Admission: EM | Admit: 2022-09-15 | Discharge: 2022-09-15 | Disposition: A | Discharge status: Home / Self Care | Payer: BC Managed Care – PPO | Attending: Emergency Medicine | Admitting: Emergency Medicine

## 2022-09-15 DIAGNOSIS — L0390 Cellulitis, unspecified: Secondary | ICD-10-CM

## 2022-09-15 DIAGNOSIS — L03116 Cellulitis of left lower limb: Secondary | ICD-10-CM | POA: Diagnosis not present

## 2022-09-15 DIAGNOSIS — Z7984 Long term (current) use of oral hypoglycemic drugs: Secondary | ICD-10-CM | POA: Insufficient documentation

## 2022-09-15 DIAGNOSIS — E119 Type 2 diabetes mellitus without complications: Secondary | ICD-10-CM | POA: Diagnosis not present

## 2022-09-15 DIAGNOSIS — M7989 Other specified soft tissue disorders: Secondary | ICD-10-CM | POA: Diagnosis not present

## 2022-09-15 LAB — BASIC METABOLIC PANEL
Anion gap: 9 (ref 5–15)
BUN: 16 mg/dL (ref 6–20)
CO2: 26 mmol/L (ref 22–32)
Calcium: 8.8 mg/dL — ABNORMAL LOW (ref 8.9–10.3)
Chloride: 99 mmol/L (ref 98–111)
Creatinine, Ser: 1.05 mg/dL (ref 0.61–1.24)
GFR, Estimated: >60 mL/min (ref >60)
Glucose, Bld: 315 mg/dL — ABNORMAL HIGH (ref 70–99)
Potassium: 3.7 mmol/L (ref 3.5–5.1)
Sodium: 134 mmol/L — ABNORMAL LOW (ref 135–145)

## 2022-09-15 LAB — CBC WITH DIFFERENTIAL/PLATELET
Abs Immature Granulocytes: 0.07 10*3/uL (ref 0.00–0.07)
Basophils Absolute: 0.1 10*3/uL (ref 0.0–0.1)
Basophils Relative: 2 %
Eosinophils Absolute: 0.3 10*3/uL (ref 0.0–0.5)
Eosinophils Relative: 4 %
HCT: 41.8 % (ref 39.0–52.0)
Hemoglobin: 14.3 g/dL (ref 13.0–17.0)
Immature Granulocytes: 1 %
Lymphocytes Relative: 25 %
Lymphs Abs: 1.8 10*3/uL (ref 0.7–4.0)
MCH: 29.1 pg (ref 26.0–34.0)
MCHC: 34.2 g/dL (ref 30.0–36.0)
MCV: 85 fL (ref 80.0–100.0)
Monocytes Absolute: 0.7 10*3/uL (ref 0.1–1.0)
Monocytes Relative: 9 %
Neutro Abs: 4.3 10*3/uL (ref 1.7–7.7)
Neutrophils Relative %: 59 %
Platelets: 247 10*3/uL (ref 150–400)
RBC: 4.92 MIL/uL (ref 4.22–5.81)
RDW: 12.4 % (ref 11.5–15.5)
WBC: 7.2 10*3/uL (ref 4.0–10.5)
nRBC: 0 % (ref 0.0–0.2)

## 2022-09-15 LAB — BRAIN NATRIURETIC PEPTIDE: B Natriuretic Peptide: 31 pg/mL (ref 0.0–100.0)

## 2022-09-15 MED ORDER — VANCOMYCIN HCL IN DEXTROSE 1-5 GM/200ML-% IV SOLN
1000.0000 mg | Freq: Once | INTRAVENOUS | Status: AC
  Administered 2022-09-15: 1000 mg via INTRAVENOUS
  Filled 2022-09-15: qty 200

## 2022-09-15 MED ORDER — DOXYCYCLINE HYCLATE 100 MG PO CAPS: 100.0000 mg | ORAL_CAPSULE | Freq: Two times a day (BID) | ORAL | 0 refills | Status: DC

## 2022-09-15 NOTE — ED Provider Notes (Signed)
[place] EMERGENCY DEPARTMENT AT [facility] Provider Note   CSN: [id number] Arrival date & time: 09/15/22  1635     History {Add pertinent medical, surgical, social history, OB history to HPI:1} Chief Complaint  Patient presents with   Leg Swelling    Brendan Stone is a 57 y.o. male.  Patient has a history of diabetes.  He complains of swelling to his left lower leg   Rash      Home Medications Prior to Admission medications   Medication Sig Start Date End Date Taking? Authorizing Provider  doxycycline (VIBRAMYCIN) 100 MG capsule Take 1 capsule (100 mg total) by mouth 2 (two) times daily. One po bid x 7 days 09/15/22  Yes Bethann Berkshire, MD  metFORMIN (GLUCOPHAGE-XR) 500 MG 24 hr tablet Take 2 tablets (1,000 mg total) by mouth 2 (two) times daily with a meal. Patient taking differently: Take 1,000 mg by mouth See admin instructions. Pt takes once weekly Dr Linton Ham. 06/13/20  Yes Deliah Boston F, FNP  Continuous Blood Gluc Receiver (FREESTYLE LIBRE 2 READER) DEVI Use to test blood sugar up to 6 times daily. DX: E11.65 01/17/20   Gwenlyn Fudge, FNP  Continuous Blood Gluc Sensor (FREESTYLE LIBRE 2 SENSOR) MISC Use to test blood sugar up to 6 times daily. DX: E11.65 01/17/20   Gwenlyn Fudge, FNP  Lancets (ACCU-CHEK SOFT TOUCH) lancets 1 each by Misc.(Non-Drug; Combo Route) route 2 times daily. 01/27/17   [provider]  Lancets [facility] Larose Kells PLUS [place]) MISC  03/13/19   [provider]  Lum Babe test strip  03/13/19   [provider]      Allergies    Tape    Review of Systems   Review of Systems  Skin:  Positive for rash.    Physical Exam Updated Vital Signs BP (!) 164/90   Pulse 88   Temp 97.9 F (36.6 C) (Oral)   Resp 17   Ht  (1.727 m)   Wt 99.8 kg   SpO2 100%   BMI 33.45 kg/m  Physical Exam  ED Results / Procedures / Treatments   Labs (all labs ordered are listed, but only abnormal results are  displayed) Labs Reviewed  BASIC METABOLIC PANEL - Abnormal; Notable for the following components:      Result Value   Sodium 134 (*)    Glucose, Bld 315 (*)    Calcium 8.8 (*)    All other components within normal limits  CBC WITH DIFFERENTIAL/PLATELET  BRAIN NATRIURETIC PEPTIDE    EKG None  Radiology No results found.  Procedures Procedures  {Document cardiac monitor, telemetry assessment procedure when appropriate:1}  Medications Ordered in ED Medications  vancomycin (VANCOCIN) IVPB 1000 mg/200 mL premix (0 mg Intravenous Stopped 09/15/22 2152)    ED Course/ Medical Decision Making/ A&P   {   Click here for ABCD2, HEART and other calculatorsREFRESH Note before signing :1}                          Medical Decision Making Risk Prescription drug management.   Patient with cellulitis to left lower leg and possible DVT.  He is put on doxycycline and will get an ultrasound tomorrow  {Document critical care time when appropriate:1} {Document review of labs and clinical decision tools ie heart score, Chads2Vasc2 etc:1}  {Document your independent review of radiology images, and any outside records:1} {Document your discussion with family members, caretakers,  and with consultants:1} {Document social determinants of health affecting pt's care:1} {Document your decision making why or why not admission, treatments were needed:1} Final Clinical Impression(s) / ED Diagnoses Final diagnoses:  Cellulitis, unspecified cellulitis site    Rx / DC Orders ED Discharge Orders          Ordered    doxycycline (VIBRAMYCIN) 100 MG capsule  2 times daily        09/15/22 2308    US Venous Img Lower Unilateral Left        09/15/22 2308

## 2022-09-15 NOTE — ED Triage Notes (Addendum)
Pt comes in complaints of bilateral leg swelling worse on the lt than rt. Patient states this has been going on for a month. Patient reports hx of swelling legs but would resolve with elevation. Patient states stabbing and throbbing pain. Lt leg edema noted and redness appearance.

## 2022-09-15 NOTE — Discharge Instructions (Signed)
Follow-up tomorrow to get the ultrasound of your leg.  If you do have a blood clot they will put you on some blood thinners.  Keep your leg clean with soap and water and try not to scratch it.  Start taking the antibiotic tomorrow.  You need to get a family doctor to follow-up with

## 2022-09-16 ENCOUNTER — Ambulatory Visit (HOSPITAL_COMMUNITY)
Admission: RE | Admit: 2022-09-16 | Discharge: 2022-09-16 | Disposition: A | Discharge status: Home / Self Care | Payer: BC Managed Care – PPO | Source: Ambulatory Visit | Attending: Emergency Medicine | Admitting: Emergency Medicine

## 2022-09-16 DIAGNOSIS — L0390 Cellulitis, unspecified: Secondary | ICD-10-CM | POA: Insufficient documentation

## 2022-09-16 DIAGNOSIS — M79605 Pain in left leg: Secondary | ICD-10-CM | POA: Diagnosis not present

## 2022-09-16 NOTE — ED Notes (Signed)
Informed pt his ultrasound was negative for any blood clots per Dr Suezanne Jacquet

## 2022-10-03 ENCOUNTER — Other Ambulatory Visit: Payer: Self-pay

## 2022-10-03 ENCOUNTER — Inpatient Hospital Stay (HOSPITAL_COMMUNITY)
Admission: EM | Admit: 2022-10-03 | Discharge: 2022-10-08 | DRG: 603 | Disposition: A | Discharge status: Home / Self Care | Payer: BC Managed Care – PPO | Attending: Internal Medicine | Admitting: Internal Medicine

## 2022-10-03 ENCOUNTER — Encounter (HOSPITAL_COMMUNITY): Payer: Self-pay | Admitting: *Deleted

## 2022-10-03 DIAGNOSIS — Z809 Family history of malignant neoplasm, unspecified: Secondary | ICD-10-CM | POA: Diagnosis not present

## 2022-10-03 DIAGNOSIS — Z823 Family history of stroke: Secondary | ICD-10-CM

## 2022-10-03 DIAGNOSIS — Z8249 Family history of ischemic heart disease and other diseases of the circulatory system: Secondary | ICD-10-CM | POA: Diagnosis not present

## 2022-10-03 DIAGNOSIS — Z803 Family history of malignant neoplasm of breast: Secondary | ICD-10-CM

## 2022-10-03 DIAGNOSIS — E669 Obesity, unspecified: Secondary | ICD-10-CM | POA: Diagnosis present

## 2022-10-03 DIAGNOSIS — Z79899 Other long term (current) drug therapy: Secondary | ICD-10-CM

## 2022-10-03 DIAGNOSIS — E785 Hyperlipidemia, unspecified: Secondary | ICD-10-CM | POA: Diagnosis not present

## 2022-10-03 DIAGNOSIS — E1165 Type 2 diabetes mellitus with hyperglycemia: Secondary | ICD-10-CM | POA: Diagnosis present

## 2022-10-03 DIAGNOSIS — Z91048 Other nonmedicinal substance allergy status: Secondary | ICD-10-CM | POA: Diagnosis not present

## 2022-10-03 DIAGNOSIS — Z7984 Long term (current) use of oral hypoglycemic drugs: Secondary | ICD-10-CM

## 2022-10-03 DIAGNOSIS — Z833 Family history of diabetes mellitus: Secondary | ICD-10-CM | POA: Diagnosis not present

## 2022-10-03 DIAGNOSIS — E1121 Type 2 diabetes mellitus with diabetic nephropathy: Secondary | ICD-10-CM | POA: Diagnosis not present

## 2022-10-03 DIAGNOSIS — L03116 Cellulitis of left lower limb: Principal | ICD-10-CM | POA: Diagnosis present

## 2022-10-03 DIAGNOSIS — Z6832 Body mass index (BMI) 32.0-32.9, adult: Secondary | ICD-10-CM | POA: Diagnosis not present

## 2022-10-03 DIAGNOSIS — I10 Essential (primary) hypertension: Secondary | ICD-10-CM | POA: Diagnosis not present

## 2022-10-03 DIAGNOSIS — R609 Edema, unspecified: Secondary | ICD-10-CM | POA: Diagnosis not present

## 2022-10-03 LAB — URINALYSIS, ROUTINE W REFLEX MICROSCOPIC
Bacteria, UA: NONE SEEN
Bilirubin Urine: NEGATIVE
Glucose, UA: >=500 mg/dL — AB
Hgb urine dipstick: NEGATIVE
Ketones, ur: NEGATIVE mg/dL
Leukocytes,Ua: NEGATIVE
Nitrite: NEGATIVE
Protein, ur: NEGATIVE mg/dL
Specific Gravity, Urine: 1.009 (ref 1.005–1.030)
pH: 7 (ref 5.0–8.0)

## 2022-10-03 LAB — COMPREHENSIVE METABOLIC PANEL
ALT: 22 U/L (ref 0–44)
AST: 17 U/L (ref 15–41)
Albumin: 3.5 g/dL (ref 3.5–5.0)
Alkaline Phosphatase: 80 U/L (ref 38–126)
Anion gap: 10 (ref 5–15)
BUN: 10 mg/dL (ref 6–20)
CO2: 24 mmol/L (ref 22–32)
Calcium: 8.8 mg/dL — ABNORMAL LOW (ref 8.9–10.3)
Chloride: 102 mmol/L (ref 98–111)
Creatinine, Ser: 0.97 mg/dL (ref 0.61–1.24)
GFR, Estimated: >60 mL/min (ref >60)
Glucose, Bld: 341 mg/dL — ABNORMAL HIGH (ref 70–99)
Potassium: 3.9 mmol/L (ref 3.5–5.1)
Sodium: 136 mmol/L (ref 135–145)
Total Bilirubin: 0.5 mg/dL (ref 0.3–1.2)
Total Protein: 6.9 g/dL (ref 6.5–8.1)

## 2022-10-03 LAB — BRAIN NATRIURETIC PEPTIDE: B Natriuretic Peptide: 49.2 pg/mL (ref 0.0–100.0)

## 2022-10-03 LAB — CBC
HCT: 39.9 % (ref 39.0–52.0)
Hemoglobin: 13.4 g/dL (ref 13.0–17.0)
MCH: 28.9 pg (ref 26.0–34.0)
MCHC: 33.6 g/dL (ref 30.0–36.0)
MCV: 86 fL (ref 80.0–100.0)
Platelets: 259 10*3/uL (ref 150–400)
RBC: 4.64 MIL/uL (ref 4.22–5.81)
RDW: 12.2 % (ref 11.5–15.5)
WBC: 7.2 10*3/uL (ref 4.0–10.5)
nRBC: 0 % (ref 0.0–0.2)

## 2022-10-03 LAB — GLUCOSE, CAPILLARY: Glucose-Capillary: 253 mg/dL — ABNORMAL HIGH (ref 70–99)

## 2022-10-03 LAB — CBG MONITORING, ED: Glucose-Capillary: 241 mg/dL — ABNORMAL HIGH (ref 70–99)

## 2022-10-03 MED ORDER — ACETAMINOPHEN 650 MG RE SUPP: 650.0000 mg | Freq: Four times a day (QID) | RECTAL | Status: DC | PRN

## 2022-10-03 MED ORDER — ONDANSETRON HCL 4 MG/2ML IJ SOLN: 4.0000 mg | Freq: Four times a day (QID) | INTRAMUSCULAR | Status: DC | PRN

## 2022-10-03 MED ORDER — ACETAMINOPHEN 325 MG PO TABS: 650.0000 mg | ORAL_TABLET | Freq: Four times a day (QID) | ORAL | Status: DC | PRN

## 2022-10-03 MED ORDER — SENNOSIDES-DOCUSATE SODIUM 8.6-50 MG PO TABS: 1.0000 | ORAL_TABLET | Freq: Every evening | ORAL | Status: DC | PRN

## 2022-10-03 MED ORDER — ENOXAPARIN SODIUM 40 MG/0.4ML IJ SOSY
40.0000 mg | PREFILLED_SYRINGE | INTRAMUSCULAR | Status: DC
  Administered 2022-10-04 – 2022-10-07 (×4): 40 mg via SUBCUTANEOUS
  Filled 2022-10-03 (×4): qty 0.4

## 2022-10-03 MED ORDER — INSULIN GLARGINE-YFGN 100 UNIT/ML ~~LOC~~ SOLN
10.0000 [IU] | Freq: Every day | SUBCUTANEOUS | Status: DC
  Administered 2022-10-04 – 2022-10-05 (×3): 10 [IU] via SUBCUTANEOUS
  Filled 2022-10-03 (×5): qty 0.1

## 2022-10-03 MED ORDER — ONDANSETRON HCL 4 MG PO TABS: 4.0000 mg | ORAL_TABLET | Freq: Four times a day (QID) | ORAL | Status: DC | PRN

## 2022-10-03 MED ORDER — VANCOMYCIN HCL 2000 MG/400ML IV SOLN
2000.0000 mg | Freq: Once | INTRAVENOUS | Status: AC
  Administered 2022-10-03: 2000 mg via INTRAVENOUS
  Filled 2022-10-03: qty 400

## 2022-10-03 MED ORDER — VANCOMYCIN HCL 1750 MG/350ML IV SOLN
1750.0000 mg | INTRAVENOUS | Status: DC
  Filled 2022-10-03: qty 350

## 2022-10-03 MED ORDER — INSULIN ASPART 100 UNIT/ML IJ SOLN
0.0000 [IU] | Freq: Three times a day (TID) | INTRAMUSCULAR | Status: DC
  Administered 2022-10-04 – 2022-10-05 (×4): 3 [IU] via SUBCUTANEOUS
  Administered 2022-10-05: 5 [IU] via SUBCUTANEOUS
  Administered 2022-10-05: 7 [IU] via SUBCUTANEOUS
  Administered 2022-10-06 (×2): 2 [IU] via SUBCUTANEOUS
  Administered 2022-10-06 – 2022-10-07 (×2): 3 [IU] via SUBCUTANEOUS
  Administered 2022-10-07: 1 [IU] via SUBCUTANEOUS
  Administered 2022-10-07 – 2022-10-08 (×2): 2 [IU] via SUBCUTANEOUS

## 2022-10-03 MED ORDER — INSULIN ASPART 100 UNIT/ML IJ SOLN
0.0000 [IU] | Freq: Every day | INTRAMUSCULAR | Status: DC
  Administered 2022-10-03 – 2022-10-04 (×2): 2 [IU] via SUBCUTANEOUS
  Administered 2022-10-05: 3 [IU] via SUBCUTANEOUS

## 2022-10-03 NOTE — ED Notes (Signed)
LLE is red with numerous, intact and nonintact blisters. Serous drainage noted on sheets. Wounds to L lower leg cleansed with saline and wrapped with clean, dry gauze to keep covered while awaiting orders for wound care.

## 2022-10-03 NOTE — ED Provider Notes (Addendum)
[place] EMERGENCY DEPARTMENT AT [facility] Provider Note   CSN: [id number] Arrival date & time: 10/03/22  1552     History  Chief Complaint  Patient presents with   Leg Pain    Brendan Stone is a 57 y.o. male with h/o DM and HTN presents to the ER for evaluation of LLE wounds and swelling  and pain for the past 2-4 weeks. He reports that [facility] and was given doxycycline which he took the entirety of.  Reports that he thinks it improved mildly.  He works and stands on his feet a lot and reports he walks between 10 to 12 miles a day.  His pain is worsened with standing. He has not been wearing any socks due to the pain in his lower legs and now has blisters on his feet.  He denies any fevers, chest pain, shortness of breath.  Reports that there has been watery oozing from the wounds as well.  He reports that he feels like both of his legs are swollen now. No known drug allergies.   Leg Pain Associated symptoms: no fever        Home Medications Prior to Admission medications   Medication Sig Start Date End Date Taking? Authorizing Provider  Continuous Blood Gluc Receiver (FREESTYLE LIBRE 2 READER) DEVI Use to test blood sugar up to 6 times daily. DX: E11.65 01/17/20   Gwenlyn Fudge, FNP  Continuous Blood Gluc Sensor (FREESTYLE LIBRE 2 SENSOR) MISC Use to test blood sugar up to 6 times daily. DX: E11.65 01/17/20   Gwenlyn Fudge, FNP  doxycycline (VIBRAMYCIN) 100 MG capsule Take 1 capsule (100 mg total) by mouth 2 (two) times daily. One po bid x 7 days 09/15/22   Bethann Berkshire, MD  Lancets (ACCU-CHEK SOFT [facility]) lancets 1 each by Misc.(Non-Drug; Combo Route) route 2 times daily. 01/27/17   [provider]  Lancets [facility] Larose Kells PLUS [place]) MISC  03/13/19   [provider]  metFORMIN (GLUCOPHAGE-XR) 500 MG 24 hr tablet Take 2 tablets (1,000 mg total) by mouth 2 (two) times daily with a meal. Patient  taking differently: Take 1,000 mg by mouth See admin instructions. Pt takes once weekly Dr Linton Ham. 06/13/20   Gwenlyn Fudge, FNP  ONETOUCH VERIO test strip  03/13/19   [provider]      Allergies    Tape    Review of Systems   Review of Systems  Constitutional:  Negative for chills and fever.  Respiratory:  Negative for shortness of breath.   Cardiovascular:  Negative for chest pain.  Musculoskeletal:  Positive for myalgias.  Skin:  Positive for wound.    Physical Exam Updated Vital Signs BP (!) 164/88   Pulse 78   Temp 98.7 F (37.1 C)   Resp 17   Ht 5\' 8"  (1.727 m)   Wt 99.8 kg   SpO2 100%   BMI 33.45 kg/m  Physical Exam Vitals and nursing note reviewed.  Constitutional:      General: He is not in acute distress.    Appearance: Normal appearance. He is not ill-appearing or toxic-appearing.  Eyes:     General: No scleral icterus. Cardiovascular:     Rate and Rhythm: Normal rate.  Pulmonary:     Effort: Pulmonary effort is normal. No respiratory distress.     Breath sounds: Normal breath sounds.  Musculoskeletal:        General:  Swelling present.     Right lower leg: Edema present.     Left lower leg: Edema present.     Comments: Overlying warmth and erythema with wounds seen to the left lower extremity.  Serous clear drainage noted.  Patient does have blisters on bilateral great toes.  Small superficial ulcerations seen to the plantar surface just below the joint of the great toe.  Compartments are firm however pliable.  He has palpable pulses.  Cap refill between 2 and 3 seconds.  Skin:    General: Skin is dry.  Neurological:     General: No focal deficit present.     Mental Status: He is alert. Mental status is at baseline.  Psychiatric:        Mood and Affect: Mood normal.                    ED Results / Procedures / Treatments   Labs (all labs ordered are listed, but only abnormal results are displayed) Labs Reviewed   COMPREHENSIVE METABOLIC PANEL - Abnormal; Notable for the following components:      Result Value   Glucose, Bld 341 (*)    Calcium 8.8 (*)    All other components within normal limits  URINALYSIS, ROUTINE W REFLEX MICROSCOPIC - Abnormal; Notable for the following components:   Color, Urine STRAW (*)    Glucose, UA >=500 (*)    All other components within normal limits  CBC  BRAIN NATRIURETIC PEPTIDE    EKG None  Radiology No results found.  Procedures Procedures   Medications Ordered in ED Medications - No data to display  ED Course/ Medical Decision Making/ A&P   Medical Decision Making Amount and/or Complexity of Data Reviewed Labs: ordered.  Risk Prescription drug management. Decision regarding hospitalization.   57 y.o. male presents to the ER for evaluation of left lower leg wounds. Differential diagnosis includes but is not limited to cellulitis versus necrotizing fasciitis versus impetigo versus DVT versus SJS. Vital signs elevated BP otherwise unremarkable. Physical exam as noted above. Please see images.   On previous chart evaluation, the patient was seen on 09/15/22 and was given doxycycline and has a negative [place] for DVT. He reports he took all of the antibiotics and thinks it has improved some, but has not gone away. He reports that he feels that both his legs are swelling now. He isn't having any chest pain or SOB.   I independently reviewed and interpreted the patient's labs.  CMP shows elevated glucose at 341.  Mildly decreased calcium at 8.8.  Otherwise, no electrolyte or LFT abnormality.  No anion gap.  Urinalysis shows greater than 500 glucose in the urine as well as straw-colored urine.  No ketones present.  CBC shows no leukocytosis or anemia.  BNP pending.   Labs not consistent with DKA. Swelling unchanged from before and the patient doesn't have any chest pain or SOB. I do not think a DVT US is emergently needed at this time and can have one in the  morning if the hospitalist see fit. Compartments are soft. Not rapidly worsening, doubt necrotizing fascitis. NV intact.  Given the patient's clinical appearance of his leg as well as already trialed outpatient therapy and failed, I do think is patient benefit from IV antibiotics. Does not meet sepsis criteria. Patient is amenable to admission as well.  Given his bilateral leg edema, could be venous insufficiency given the patient is on his feet for extended mount  of time.  Did add on BNP as well.  Lungs are clear and additionally he is not complaining of any chest pain or shortness of breath.  I discussed admission with patient and family were at bedside which they are amenable to.  Will admit to medicine.  Dr. Allena Katz to admit.   I discussed this case with my attending physician who cosigned this note including patient's presenting symptoms, physical exam, and planned diagnostics and interventions. Attending physician stated agreement with plan or made changes to plan which were implemented.   Final Clinical Impression(s) / ED Diagnoses Final diagnoses:  Cellulitis of left lower extremity  Hyperglycemia due to diabetes mellitus [facility])    Rx / DC Orders ED Discharge Orders     None         Achille Rich, PA-C 10/03/22 2030    Achille Rich, PA-C 10/03/22 2035    Gloris Manchester, MD 10/03/22 2140

## 2022-10-03 NOTE — Hospital Course (Signed)
Brendan Stone is a 57 y.o. male with medical history significant for type 2 diabetes who is admitted with cellulitis of the left lower extremity failing outpatient therapy.

## 2022-10-03 NOTE — ED Notes (Signed)
ED TO INPATIENT HANDOFF REPORT  ED Nurse Name and Phone #: Lowanda Foster [phone]  S Name/Age/Gender Brendan Stone 57 y.o. male Room/Bed: 003C/003C  Code Status   Code Status: Full Code  Home/SNF/Other Home Patient oriented to: self, place, time, and situation Is this baseline? Yes   Triage Complete: Triage complete  Chief Complaint Cellulitis of left lower extremity [L03.116]  Triage Note The pt is c/o lt leg pain and redness for several weeks  he was seen at [facility] 2-3 weeks ago but was not admitted   red lesions on his lt leg  he just recently  lost his doctor  and he currently does not have a regular doctor.  He is a diabetic no known temp  no pain at present   Allergies Allergies  Allergen Reactions   Tape Rash    Level of Care/Admitting Diagnosis ED Disposition     ED Disposition  Admit   Condition  --   Comment  Hospital Area: [facility] [[id number]]  Level of Care: Med-Surg [16]  May place patient in observation at [facility] or [facility] if equivalent level of care is available:: No  Covid Evaluation: Asymptomatic - no recent exposure (last 10 days) testing not required  Diagnosis: Cellulitis of left lower extremity [960454]  Admitting Physician: Charlsie Quest [[id number]]  Attending Physician: Charlsie Quest [[id number]]          B Medical/Surgery History Past Medical History:  Diagnosis Date   Back pain    Diabetes mellitus without complication (HCC)    Diabetic nephropathy (HCC)    Hyperlipidemia    Pneumonia    Vitamin D deficiency    Past Surgical History:  Procedure Laterality Date   EYE SURGERY     HAND SURGERY     rt   REPAIR KNEE LIGAMENT     rt   ROTATOR CUFF REPAIR     rt     A IV Location/Drains/Wounds Patient Lines/Drains/Airways Status     Active Line/Drains/Airways     Name Placement date Placement time Site Days   Peripheral IV 10/03/22 Anterior;Distal;Left;Upper Arm 10/03/22  1848  Arm  less than 1             Intake/Output Last 24 hours  Intake/Output Summary (Last 24 hours) at 10/03/2022 2221 Last data filed at 10/03/2022 2218 Gross per 24 hour  Intake 400 ml  Output --  Net 400 ml    Labs/Imaging Results for orders placed or performed during the hospital encounter of 10/03/22 (from the past 48 hour(s))  Comprehensive metabolic panel     Status: Abnormal   Collection Time: 10/03/22  4:21 PM  Result Value Ref Range   Sodium 136 135 - 145 mmol/L   Potassium 3.9 3.5 - 5.1 mmol/L   Chloride 102 98 - 111 mmol/L   CO2 24 22 - 32 mmol/L   Glucose, Bld 341 (H) 70 - 99 mg/dL    Comment: Glucose reference range applies only to samples taken after fasting for at least 8 hours.   BUN 10 6 - 20 mg/dL   Creatinine, Ser 2.13 0.61 - 1.24 mg/dL   Calcium 8.8 (L) 8.9 - 10.3 mg/dL   Total Protein 6.9 6.5 - 8.1 g/dL   Albumin 3.5 3.5 - 5.0 g/dL   AST 17 15 - 41 U/L   ALT 22 0 - 44 U/L   Alkaline Phosphatase 80 38 - 126 U/L   Total Bilirubin 0.5  0.3 - 1.2 mg/dL   GFR, Estimated >16 >10 mL/min    Comment: (NOTE) Calculated using the CKD-EPI Creatinine Equation (2021)    Anion gap 10 5 - 15    Comment: Performed at [facility], [address], [place], [place] [address]  CBC     Status: None   Collection Time: 10/03/22  4:21 PM  Result Value Ref Range   WBC 7.2 4.0 - 10.5 K/uL   RBC 4.64 4.22 - 5.81 MIL/uL   Hemoglobin 13.4 13.0 - 17.0 g/dL   HCT 40.9 81.1 - 91.4 %   MCV 86.0 80.0 - 100.0 fL   MCH 28.9 26.0 - 34.0 pg   MCHC 33.6 30.0 - 36.0 g/dL   RDW 78.2 95.6 - 21.3 %   Platelets 259 150 - 400 K/uL   nRBC 0.0 0.0 - 0.2 %    Comment: Performed at [facility], [address] [place], [place] [address]  Urinalysis, Routine w reflex microscopic -Urine, Clean Catch     Status: Abnormal   Collection Time: 10/03/22  4:35 PM  Result Value Ref Range   Color, Urine STRAW (A) YELLOW   APPearance CLEAR CLEAR   Specific Gravity, Urine 1.009 1.005 - 1.030   pH 7.0 5.0  - 8.0   Glucose, UA >=500 (A) NEGATIVE mg/dL   Hgb urine dipstick NEGATIVE NEGATIVE   Bilirubin Urine NEGATIVE NEGATIVE   Ketones, ur NEGATIVE NEGATIVE mg/dL   Protein, ur NEGATIVE NEGATIVE mg/dL   Nitrite NEGATIVE NEGATIVE   Leukocytes,Ua NEGATIVE NEGATIVE   RBC / HPF 0-5 0 - 5 RBC/hpf   WBC, UA 0-5 0 - 5 WBC/hpf   Bacteria, UA NONE SEEN NONE SEEN   Squamous Epithelial / HPF 0-5 0 - 5 /HPF    Comment: Performed at [facility], [address], [place], [place] [address]   No results found.  Pending Labs Unresulted Labs (From admission, onward)     Start     Ordered   10/04/22 0500  HIV Antibody (routine testing w rflx)  (HIV Antibody (Routine testing w reflex) panel)  Tomorrow morning,   R        10/03/22 2217   10/04/22 0500  CBC  Tomorrow morning,   R        10/03/22 2218   10/04/22 0500  Basic metabolic panel  Tomorrow morning,   R        10/03/22 2218   10/04/22 0500  Hemoglobin A1c  Tomorrow morning,   R       Comments: To assess prior glycemic control    10/03/22 2218   10/03/22 1739  Brain natriuretic peptide  Once,   URGENT        10/03/22 1738            Vitals/Pain Today's Vitals   10/03/22 1800 10/03/22 1815 10/03/22 2030 10/03/22 2145  BP: 118/65 (!) 167/89 (!) 142/73   Pulse: 63 87 60   Resp:   18   Temp:    98.9 F (37.2 C)  TempSrc:    Oral  SpO2: 95% 100% 99%   Weight:      Height:      PainSc:        Isolation Precautions No active isolations  Medications Medications  vancomycin (VANCOREADY) IVPB 2000 mg/400 mL (0 mg Intravenous Stopped 10/03/22 2218)    Followed by  vancomycin (VANCOREADY) IVPB 1750 mg/350 mL (has no administration in  time range)  enoxaparin (LOVENOX) injection 40 mg (has no administration in time range)  acetaminophen (TYLENOL) tablet 650 mg (has no administration in time range)    Or  acetaminophen (TYLENOL) suppository 650 mg (has no administration in time range)  ondansetron (ZOFRAN) tablet 4 mg (has no  administration in time range)    Or  ondansetron (ZOFRAN) injection 4 mg (has no administration in time range)  senna-docusate (Senokot-S) tablet 1 tablet (has no administration in time range)  insulin glargine-yfgn (SEMGLEE) injection 10 Units (has no administration in time range)  insulin aspart (novoLOG) injection 0-9 Units (has no administration in time range)  insulin aspart (novoLOG) injection 0-5 Units (has no administration in time range)    Mobility walks     Focused Assessments See peripheral vasc - pitting edema, redness, blistering to LLE. Wound to R sole that is healing and scabbed.    R Recommendations: See Admitting Provider Note  Report given to:   Additional Notes: Anbulatory, A&Ox4

## 2022-10-03 NOTE — ED Notes (Signed)
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

## 2022-10-03 NOTE — H&P (Signed)
History and Physical    Brendan Stone:[id number] DOB: 09-19-1965 DOA: 10/03/2022  PCP: Gwenlyn Fudge, FNP  Patient coming from: Home  I have personally briefly reviewed patient's old medical records in [facility]  Chief Complaint: Cellulitis left lower leg  HPI: Brendan Stone is a 57 y.o. male with medical history significant for type 2 diabetes who presented to the ED for evaluation of persistent redness and swelling to his left leg.  Patient was previously seen in the ED 4/23 and diagnosed with left lower extremity cellulitis.  He was discharged on a course of doxycycline which he completed.  He says he did see some improvement in the redness on his leg after completing antibiotics however this has returned.  He has had clear weeping of his left lower leg and is seeing blistering over the erythematous area of his left lower leg.    He is also having swelling of both of his legs.  Because of the swelling he has been wearing shoes without socks and has now seen blistering on the medial sides of both first toes.  He denies fevers, chills, diaphoresis, nausea, vomit, abdominal pain, chest pain, dyspnea.  He states that his primary care practitioner is no longer working and he currently has no PCP.  ED Course  Labs/Imaging on admission: I have personally reviewed following labs and imaging studies.  Initial vitals showed BP 159/99, pulse 79, RR 18, temp 98.9 F, SpO2 100% on room air.  Labs show sodium 136, potassium 3.9, bicarb 24, BUN 10, creatinine 0.97, serum glucose 341, LFTs within normal limits, WBC 7.2, hemoglobin 13.4, platelets 259,000.  UA negative for UTI.  Patient was given IV vancomycin.  The hospitalist service was consulted to admit for further evaluation and management.  Review of Systems: All systems reviewed and are negative except as documented in history of present illness above.   Past Medical History:  Diagnosis Date   Back pain    Diabetes mellitus  without complication (HCC)    Diabetic nephropathy (HCC)    Hyperlipidemia    Pneumonia    Vitamin D deficiency     Past Surgical History:  Procedure Laterality Date   EYE SURGERY     HAND SURGERY     rt   REPAIR KNEE LIGAMENT     rt   ROTATOR CUFF REPAIR     rt    Social History:  reports that he has never smoked. He has never used smokeless tobacco. He reports current alcohol use. He reports that he does not use drugs.  Allergies  Allergen Reactions   Tape Rash    Family History  Problem Relation Age of Onset   Breast cancer Mother    Heart disease Father    Diabetes Father    Cancer Maternal Grandmother        unknown type   Stroke Maternal Grandfather    Stroke Paternal Grandmother    Appendicitis Paternal Grandfather        cause of death     Prior to Admission medications   Medication Sig Start Date End Date Taking? Authorizing Provider  Continuous Blood Gluc Receiver (FREESTYLE LIBRE 2 READER) DEVI Use to test blood sugar up to 6 times daily. DX: E11.65 01/17/20   Gwenlyn Fudge, FNP  Continuous Blood Gluc Sensor (FREESTYLE LIBRE 2 SENSOR) MISC Use to test blood sugar up to 6 times daily. DX: E11.65 01/17/20   Gwenlyn Fudge, FNP  doxycycline (  VIBRAMYCIN) 100 MG capsule Take 1 capsule (100 mg total) by mouth 2 (two) times daily. One po bid x 7 days 09/15/22   Bethann Berkshire, MD  Lancets (ACCU-CHEK SOFT [facility]) lancets 1 each by Misc.(Non-Drug; Combo Route) route 2 times daily. 01/27/17   [provider]  Lancets [facility] [place] PLUS [place]) MISC  03/13/19   [provider]  metFORMIN (GLUCOPHAGE-XR) 500 MG 24 hr tablet Take 2 tablets (1,000 mg total) by mouth 2 (two) times daily with a meal. Patient taking differently: Take 1,000 mg by mouth See admin instructions. Pt takes once weekly Dr Linton Ham. 06/13/20   Gwenlyn Fudge, FNP  [facility] VERIO test strip  03/13/19   [provider]    Physical Exam: Vitals:   10/03/22 1800  10/03/22 1815 10/03/22 2030 10/03/22 2145  BP: 118/65 (!) 167/89 (!) 142/73   Pulse: 63 87 60   Resp:   18   Temp:    98.9 F (37.2 C)  TempSrc:    Oral  SpO2: 95% 100% 99%   Weight:      Height:       Constitutional: Resting in bed, NAD, calm, comfortable Eyes: EOMI, lids and conjunctivae normal ENMT: Mucous membranes are moist. Posterior pharynx clear of any exudate or lesions.Normal dentition.  Neck: normal, supple, no masses. Respiratory: clear to auscultation bilaterally, no wheezing, no crackles. Normal respiratory effort. No accessory muscle use.  Cardiovascular: Regular rate and rhythm, no murmurs / rubs / gallops.  +1 bilateral lower extremity edema. 2+ pedal pulses. Abdomen: no tenderness, no masses palpated.  Musculoskeletal: no clubbing / cyanosis. No joint deformity upper and lower extremities. Good ROM, no contractures. Normal muscle tone.  Skin: Erythema with pustules and blistering over the left lower leg as pictured below.  Blistering bilateral medial toes also as pictured below. Neurologic: CN 2-12 grossly intact. Sensation intact. Strength 5/5 in all 4.  Psychiatric: Normal judgment and insight. Alert and oriented x 3. Normal mood.                 EKG: Not performed.  Assessment/Plan Principal Problem:   Cellulitis of left lower extremity Active Problems:   Type 2 diabetes mellitus with hyperglycemia, without long-term current use of insulin (HCC)   [name] is a 57 y.o. male with medical history significant for type 2 diabetes who is admitted with cellulitis of the left lower extremity failing outpatient therapy.  Assessment and Plan: Cellulitis of left lower extremity: Failed outpatient antibiotics.  Continue IV vancomycin.  Type 2 diabetes with hyperglycemia: Hyperglycemic without evidence of DKA/HHS.  Started on Semglee 10 units nightly plus SSI.  Holding metformin for now.  Check A1c.   DVT prophylaxis: enoxaparin (LOVENOX) injection  40 mg Start: 10/03/22 2230 Code Status: Full code, confirmed with patient on admission Family Communication: Discussed with patient, he has discussed with family Disposition Plan: From home and likely discharge to home pending clinical progress Consults called: None Severity of Illness: The appropriate patient status for this patient is OBSERVATION. Observation status is judged to be reasonable and necessary in order to provide the required intensity of service to ensure the patient's safety. The patient's presenting symptoms, physical exam findings, and initial radiographic and laboratory data in the context of their medical condition is felt to place them at decreased risk for further clinical deterioration. Furthermore, it is anticipated that the patient will be medically stable for discharge from the hospital within 2 midnights of admission.   Zanyiah Posten  Allena Katz MD [facility]  If 7PM-7AM, please contact night-coverage [email or web address]  10/03/2022, 10:23 PM

## 2022-10-03 NOTE — ED Triage Notes (Signed)
The pt is c/o lt leg pain and redness for several weeks  he was seen at [facility] 2-3 weeks ago but was not admitted   red lesions on his lt leg  he just recently  lost his doctor  and he currently does not have a regular doctor.  He is a diabetic no known temp  no pain at present

## 2022-10-03 NOTE — Progress Notes (Signed)
Pharmacy Antibiotic Note  Brendan Stone is a 57 y.o. male for which pharmacy has been consulted for vancomycin dosing for cellulitis.  Patient with a history of DM. Patient presenting with LLE wounds and swelling x 2-4 weeks.  4/24 [place] negative for left LE DVT  SCr 0.97 WBC 7.2; T 98.7; HR 87; RR 17  Plan: Vancomycin 2000 mg once then 1750 mg q24hr (eAUC 488) unless change in renal function Trend WBC, Fever, Renal function F/u cultures, clinical course, WBC De-escalate when able  Height: 5\' 8"  (172.7 cm) Weight: 99.8 kg (220 lb 0.3 oz) IBW/kg (Calculated) : 68.4  Temp (24hrs), Avg:98.8 F (37.1 C), Min:98.7 F (37.1 C), Max:98.9 F (37.2 C)  Recent Labs  Lab 10/03/22 1621  WBC 7.2  CREATININE 0.97    Estimated Creatinine Clearance: 96.3 mL/min (by C-G formula based on SCr of 0.97 mg/dL).    Allergies  Allergen Reactions   Tape Rash   Microbiology results: Pending  Thank you for allowing pharmacy to be a part of this patient's care.  Delmar Landau, PharmD, BCPS 10/03/2022 6:45 PM ED Clinical Pharmacist -  [phone]

## 2022-10-04 DIAGNOSIS — Z8249 Family history of ischemic heart disease and other diseases of the circulatory system: Secondary | ICD-10-CM | POA: Diagnosis not present

## 2022-10-04 DIAGNOSIS — Z7984 Long term (current) use of oral hypoglycemic drugs: Secondary | ICD-10-CM | POA: Diagnosis not present

## 2022-10-04 DIAGNOSIS — Z79899 Other long term (current) drug therapy: Secondary | ICD-10-CM | POA: Diagnosis not present

## 2022-10-04 DIAGNOSIS — Z803 Family history of malignant neoplasm of breast: Secondary | ICD-10-CM | POA: Diagnosis not present

## 2022-10-04 DIAGNOSIS — Z91048 Other nonmedicinal substance allergy status: Secondary | ICD-10-CM | POA: Diagnosis not present

## 2022-10-04 DIAGNOSIS — Z809 Family history of malignant neoplasm, unspecified: Secondary | ICD-10-CM | POA: Diagnosis not present

## 2022-10-04 DIAGNOSIS — Z823 Family history of stroke: Secondary | ICD-10-CM | POA: Diagnosis not present

## 2022-10-04 DIAGNOSIS — R609 Edema, unspecified: Secondary | ICD-10-CM | POA: Diagnosis not present

## 2022-10-04 DIAGNOSIS — Z833 Family history of diabetes mellitus: Secondary | ICD-10-CM | POA: Diagnosis not present

## 2022-10-04 DIAGNOSIS — E785 Hyperlipidemia, unspecified: Secondary | ICD-10-CM | POA: Diagnosis present

## 2022-10-04 DIAGNOSIS — E669 Obesity, unspecified: Secondary | ICD-10-CM | POA: Diagnosis present

## 2022-10-04 DIAGNOSIS — E1121 Type 2 diabetes mellitus with diabetic nephropathy: Secondary | ICD-10-CM | POA: Diagnosis present

## 2022-10-04 DIAGNOSIS — I10 Essential (primary) hypertension: Secondary | ICD-10-CM | POA: Diagnosis present

## 2022-10-04 DIAGNOSIS — E1165 Type 2 diabetes mellitus with hyperglycemia: Secondary | ICD-10-CM | POA: Diagnosis present

## 2022-10-04 DIAGNOSIS — Z6832 Body mass index (BMI) 32.0-32.9, adult: Secondary | ICD-10-CM | POA: Diagnosis not present

## 2022-10-04 DIAGNOSIS — L03116 Cellulitis of left lower limb: Secondary | ICD-10-CM | POA: Diagnosis present

## 2022-10-04 LAB — CBC
HCT: 36.7 % — ABNORMAL LOW (ref 39.0–52.0)
Hemoglobin: 12.4 g/dL — ABNORMAL LOW (ref 13.0–17.0)
MCH: 28.1 pg (ref 26.0–34.0)
MCHC: 33.8 g/dL (ref 30.0–36.0)
MCV: 83.2 fL (ref 80.0–100.0)
Platelets: 217 10*3/uL (ref 150–400)
RBC: 4.41 MIL/uL (ref 4.22–5.81)
RDW: 12.4 % (ref 11.5–15.5)
WBC: 6.2 10*3/uL (ref 4.0–10.5)
nRBC: 0 % (ref 0.0–0.2)

## 2022-10-04 LAB — BASIC METABOLIC PANEL
Anion gap: 6 (ref 5–15)
BUN: 9 mg/dL (ref 6–20)
CO2: 24 mmol/L (ref 22–32)
Calcium: 8.4 mg/dL — ABNORMAL LOW (ref 8.9–10.3)
Chloride: 107 mmol/L (ref 98–111)
Creatinine, Ser: 0.78 mg/dL (ref 0.61–1.24)
GFR, Estimated: >60 mL/min (ref >60)
Glucose, Bld: 272 mg/dL — ABNORMAL HIGH (ref 70–99)
Potassium: 3.7 mmol/L (ref 3.5–5.1)
Sodium: 137 mmol/L (ref 135–145)

## 2022-10-04 LAB — GLUCOSE, CAPILLARY
Glucose-Capillary: 235 mg/dL — ABNORMAL HIGH (ref 70–99)
Glucose-Capillary: 211 mg/dL — ABNORMAL HIGH (ref 70–99)
Glucose-Capillary: 207 mg/dL — ABNORMAL HIGH (ref 70–99)
Glucose-Capillary: 240 mg/dL — ABNORMAL HIGH (ref 70–99)

## 2022-10-04 LAB — HIV ANTIBODY (ROUTINE TESTING W REFLEX): HIV Screen 4th Generation wRfx: NONREACTIVE

## 2022-10-04 LAB — HEMOGLOBIN A1C
Hgb A1c MFr Bld: 12.3 % — ABNORMAL HIGH (ref 4.8–5.6)
Mean Plasma Glucose: 306.31 mg/dL

## 2022-10-04 LAB — MRSA NEXT GEN BY PCR, NASAL: MRSA by PCR Next Gen: NOT DETECTED

## 2022-10-04 MED ORDER — SODIUM CHLORIDE 0.9 % IV SOLN
2.0000 g | INTRAVENOUS | Status: DC
  Administered 2022-10-04 – 2022-10-07 (×4): 2 g via INTRAVENOUS
  Filled 2022-10-04 (×4): qty 20

## 2022-10-04 MED ORDER — VANCOMYCIN HCL 1500 MG/300ML IV SOLN
1500.0000 mg | Freq: Two times a day (BID) | INTRAVENOUS | Status: DC
  Administered 2022-10-04 – 2022-10-05 (×2): 1500 mg via INTRAVENOUS
  Filled 2022-10-04 (×2): qty 300

## 2022-10-04 NOTE — Progress Notes (Addendum)
PROGRESS NOTE  Brendan Stone VOH:[id number] DOB: 1965/10/22 DOA: 10/03/2022 PCP: Gwenlyn Fudge, FNP  HPI/Recap of past 24 hours: SHAKEL Brendan Stone is a 57 y.o. male with medical history significant for type 2 diabetes who presented to the ED for evaluation of persistent redness and swelling to his left leg for 1 month.  Previously seen in the ED 4/23 and diagnosed with left lower extremity cellulitis.  He was discharged on a course of doxycycline which he completed.  He says he did see some improvement in the redness on his leg after completing antibiotics however this has returned.  He has had clear weeping of his left lower leg and is seeing blistering over the erythematous area of his left lower leg.     He is also having swelling of both of his legs.  Because of the swelling he has been wearing shoes without socks and has now seen blistering on the medial sides of both first toes.  He denies fevers, chills, diaphoresis, nausea, vomit, abdominal pain, chest pain, dyspnea.  He states that his primary care practitioner is no longer working and he currently has no PCP.  10/04/2022: The patient was seen and examined this visit.  Still symptomatic with erythema, edema, warmth and tenderness.  Currently on IV vancomycin.  MRSA screening is pending.  Assessment/Plan: Principal Problem:   Cellulitis of left lower extremity Active Problems:   Type 2 diabetes mellitus with hyperglycemia, without long-term current use of insulin (HCC)  Cellulitis of left lower extremity, POA: Failed outpatient antibiotics.  Continue IV vancomycin. Add Rocephin to broaden coverage Follow MRSA screening test.   Type 2 diabetes with hyperglycemia: Hyperglycemic without evidence of DKA/HHS.  Started on Semglee 10 units nightly plus SSI.  Holding metformin for now.  Hemoglobin A1c 12.3 on 10/12/2022.  Obesity BMI 32 Recommend weight loss outpatient with regular physical activity and healthy dieting.  Physical  debility PT assessment    DVT prophylaxis: enoxaparin (LOVENOX) injection 40 mg Start: 10/03/22 2230 Code Status: Full code, confirmed with patient on admission Family Communication: None at bedside. Disposition Plan: From home and likely discharge to home pending clinical progress Consults called: None   Status is: Inpatient The patient requires at least 2 midnights for further evaluation and treatment of present condition.    Objective: Vitals:   10/03/22 2306 10/04/22 0441 10/04/22 0751 10/04/22 1627  BP: (!) 175/92 (!) 140/71 138/81 136/77  Pulse: 83 72 71 71  Resp: 18 16 16 17   Temp: (!) 97.5 F (36.4 C) 98.3 F (36.8 C) 98.7 F (37.1 C) 98.5 F (36.9 C)  TempSrc: Oral  Oral   SpO2: 99% 97% 98% 97%  Weight:      Height:        Intake/Output Summary (Last 24 hours) at 10/04/2022 1744 Last data filed at 10/04/2022 1256 Gross per 24 hour  Intake 520 ml  Output --  Net 520 ml   Filed Weights   10/03/22 1601 10/03/22 2259  Weight: 99.8 kg 98.3 kg    Exam:  General: 57 y.o. year-old male well developed well nourished in no acute distress.  Alert and oriented x3. Cardiovascular: Regular rate and rhythm with no rubs or gallops.  No thyromegaly or JVD noted.   Respiratory: Clear to auscultation with no wheezes or rales. Good inspiratory effort. Abdomen: Soft nontender nondistended with normal bowel sounds x4 quadrants. Musculoskeletal: No lower extremity edema. 2/4 pulses in all 4 extremities. Skin: Psychiatry: Mood is appropriate for  condition and setting   Data Reviewed: CBC: Recent Labs  Lab 10/03/22 1621 10/04/22 0451  WBC 7.2 6.2  HGB 13.4 12.4*  HCT 39.9 36.7*  MCV 86.0 83.2  PLT 259 217   Basic Metabolic Panel: Recent Labs  Lab 10/03/22 1621 10/04/22 0451  NA 136 137  K 3.9 3.7  CL 102 107  CO2 24 24  GLUCOSE 341* 272*  BUN 10 9  CREATININE 0.97 0.78  CALCIUM 8.8* 8.4*   GFR: Estimated Creatinine Clearance: 115.9 mL/min (by C-G  formula based on SCr of 0.78 mg/dL). Liver Function Tests: Recent Labs  Lab 10/03/22 1621  AST 17  ALT 22  ALKPHOS 80  BILITOT 0.5  PROT 6.9  ALBUMIN 3.5   No results for input(s): "LIPASE", "AMYLASE" in the last 168 hours. No results for input(s): "AMMONIA" in the last 168 hours. Coagulation Profile: No results for input(s): "INR", "PROTIME" in the last 168 hours. Cardiac Enzymes: No results for input(s): "CKTOTAL", "CKMB", "CKMBINDEX", "TROPONINI" in the last 168 hours. BNP (last 3 results) No results for input(s): "PROBNP" in the last 8760 hours. HbA1C: Recent Labs    10/04/22 0451  HGBA1C 12.3*   CBG: Recent Labs  Lab 10/03/22 2235 10/03/22 2314 10/04/22 0753 10/04/22 1252 10/04/22 1628  GLUCAP 241* 253* 235* 211* 207*   Lipid Profile: No results for input(s): "CHOL", "HDL", "LDLCALC", "TRIG", "CHOLHDL", "LDLDIRECT" in the last 72 hours. Thyroid Function Tests: No results for input(s): "TSH", "T4TOTAL", "FREET4", "T3FREE", "THYROIDAB" in the last 72 hours. Anemia Panel: No results for input(s): "VITAMINB12", "FOLATE", "FERRITIN", "TIBC", "IRON", "RETICCTPCT" in the last 72 hours. Urine analysis:    Component Value Date/Time   COLORURINE STRAW (A) 10/03/2022 1635   APPEARANCEUR CLEAR 10/03/2022 1635   LABSPEC 1.009 10/03/2022 1635   PHURINE 7.0 10/03/2022 1635   GLUCOSEU >=500 (A) 10/03/2022 1635   HGBUR NEGATIVE 10/03/2022 1635   BILIRUBINUR NEGATIVE 10/03/2022 1635   KETONESUR NEGATIVE 10/03/2022 1635   PROTEINUR NEGATIVE 10/03/2022 1635   UROBILINOGEN 0.2 02/01/2009 0809   NITRITE NEGATIVE 10/03/2022 1635   LEUKOCYTESUR NEGATIVE 10/03/2022 1635   Sepsis Labs: @LABRCNTIP (procalcitonin:4,lacticidven:4)  )No results found for this or any previous visit (from the past 240 hour(s)).    Studies: No results found.  Scheduled Meds:  enoxaparin (LOVENOX) injection  40 mg Subcutaneous Q24H   insulin aspart  0-5 Units Subcutaneous QHS   insulin aspart   0-9 Units Subcutaneous TID WC   insulin glargine-yfgn  10 Units Subcutaneous QHS    Continuous Infusions:  vancomycin       LOS: 0 days     Darlin Drop, MD [facility] Pager [phone]  If 7PM-7AM, please contact night-coverage [email or web address] Password [facility] 10/04/2022, 5:44 PM

## 2022-10-04 NOTE — Progress Notes (Signed)
Pharmacy Antibiotic Note  Brendan Stone is a 57 y.o. male for which pharmacy has been consulted for vancomycin dosing for cellulitis.  Patient with a history of DM. Patient presenting with LLE wounds and swelling x 2-4 weeks.  4/24 [place] negative for left LE DVT  SCr 0.78 today  WBC 6.2; T 98.9; HR 71; RR 16  His creatinine clearance has improved slightly since initial dosing on 5/11. Will plan to increase maintenance dose.   Plan: Start vancomycin 1500mg  q12 hours (Scr 0.8, IBW, Vd 0.72, eAUC 491) Trend WBC, Fever, Renal function F/u cultures, clinical course, WBC De-escalate when able  Height: 5\' 8"  (172.7 cm) Weight: 98.3 kg (216 lb 11.4 oz) IBW/kg (Calculated) : 68.4  Temp (24hrs), Avg:98.5 F (36.9 C), Min:97.5 F (36.4 C), Max:98.9 F (37.2 C)  Recent Labs  Lab 10/03/22 1621 10/04/22 0451  WBC 7.2 6.2  CREATININE 0.97 0.78     Estimated Creatinine Clearance: 115.9 mL/min (by C-G formula based on SCr of 0.78 mg/dL).    Allergies  Allergen Reactions   Tape Rash   Microbiology results: Pending  Thank you for allowing pharmacy to be a part of this patient's care.  Blane Ohara, PharmD  PGY1 Pharmacy Resident

## 2022-10-04 NOTE — Plan of Care (Signed)

## 2022-10-05 ENCOUNTER — Other Ambulatory Visit (HOSPITAL_COMMUNITY): Payer: Self-pay

## 2022-10-05 ENCOUNTER — Inpatient Hospital Stay (HOSPITAL_COMMUNITY): Payer: BC Managed Care – PPO

## 2022-10-05 DIAGNOSIS — L03116 Cellulitis of left lower limb: Secondary | ICD-10-CM | POA: Diagnosis not present

## 2022-10-05 DIAGNOSIS — R609 Edema, unspecified: Secondary | ICD-10-CM | POA: Diagnosis not present

## 2022-10-05 LAB — BASIC METABOLIC PANEL
Anion gap: 9 (ref 5–15)
BUN: 14 mg/dL (ref 6–20)
CO2: 23 mmol/L (ref 22–32)
Calcium: 8.3 mg/dL — ABNORMAL LOW (ref 8.9–10.3)
Chloride: 103 mmol/L (ref 98–111)
Creatinine, Ser: 0.9 mg/dL (ref 0.61–1.24)
GFR, Estimated: >60 mL/min (ref >60)
Glucose, Bld: 189 mg/dL — ABNORMAL HIGH (ref 70–99)
Potassium: 3.7 mmol/L (ref 3.5–5.1)
Sodium: 135 mmol/L (ref 135–145)

## 2022-10-05 LAB — CBC
HCT: 40.9 % (ref 39.0–52.0)
Hemoglobin: 13.5 g/dL (ref 13.0–17.0)
MCH: 28.5 pg (ref 26.0–34.0)
MCHC: 33 g/dL (ref 30.0–36.0)
MCV: 86.3 fL (ref 80.0–100.0)
Platelets: 234 10*3/uL (ref 150–400)
RBC: 4.74 MIL/uL (ref 4.22–5.81)
RDW: 12.3 % (ref 11.5–15.5)
WBC: 7.2 10*3/uL (ref 4.0–10.5)
nRBC: 0 % (ref 0.0–0.2)

## 2022-10-05 LAB — MAGNESIUM: Magnesium: 2 mg/dL (ref 1.7–2.4)

## 2022-10-05 LAB — VAS US ABI WITH/WO TBI
Left ABI: 1.01
Right ABI: 1.18

## 2022-10-05 LAB — GLUCOSE, CAPILLARY
Glucose-Capillary: 289 mg/dL — ABNORMAL HIGH (ref 70–99)
Glucose-Capillary: 220 mg/dL — ABNORMAL HIGH (ref 70–99)
Glucose-Capillary: 336 mg/dL — ABNORMAL HIGH (ref 70–99)
Glucose-Capillary: 297 mg/dL — ABNORMAL HIGH (ref 70–99)

## 2022-10-05 MED ORDER — INSULIN STARTER KIT- PEN NEEDLES (ENGLISH)
1.0000 | Freq: Once | Status: AC
  Administered 2022-10-05: 1
  Filled 2022-10-05: qty 1

## 2022-10-05 MED ORDER — VANCOMYCIN HCL IN DEXTROSE 1-5 GM/200ML-% IV SOLN
1000.0000 mg | Freq: Two times a day (BID) | INTRAVENOUS | Status: DC
  Administered 2022-10-05: 1000 mg via INTRAVENOUS
  Filled 2022-10-05: qty 200

## 2022-10-05 NOTE — Inpatient Diabetes Management (Signed)
Inpatient Diabetes Program Recommendations  AACE/ADA: New Consensus Statement on Inpatient Glycemic Control (2015)  Target Ranges:  Prepandial:   less than 140 mg/dL      Peak postprandial:   less than 180 mg/dL (1-2 hours)      Critically ill patients:  140 - 180 mg/dL   Lab Results  Component Value Date   GLUCAP 220 (H) 10/05/2022   HGBA1C 12.3 (H) 10/04/2022    Review of Glycemic Control  Latest Reference Range & Units 10/04/22 16:28 10/04/22 21:01 10/05/22 08:52 10/05/22 12:10  Glucose-Capillary 70 - 99 mg/dL 161 (H) 096 (H) 045 (H) 220 (H)  (H): Data is abnormally high Diabetes history: Type 2 DM Outpatient Diabetes medications: Metformin 1000 mg BID Current orders for Inpatient glycemic control: Semglee 10 units QD, Novolog 0-9 units TID & HS  Inpatient Diabetes Program Recommendations:    Consider increasing Semglee to 18 units QD.   Spoke with patient regarding outpatient diabetes management. Patient verifies Metformin and reports compliance, but it has been several years since seeing PCP due to his doctor moving practices.  Reviewed patient's current A1c of 12.3%. Explained what a A1c is and what it measures. Also reviewed goal A1c with patient, importance of good glucose control @ home, and blood sugar goals. Reviewed patho of DM, role of pancreas, impact of glycemic control and infection risk, survival skills, interventions, vascular changes and commorbidities.  Patient has not been checking blood sugars. Will need a glucose meter at discharge. Also, discussed Freestyle libre 3 with patient and downloaded application. He is interested. Encouraged to go ahead and find another PCP and make appointment for follow up.  Denies drinking sugary beverages and showed activity level on app from his phone. Attempts to be mindful with CHO intake. Reviewed basic carb counting, importance of protein, and plate method. Dietitian consult placed and [facility] to further reinforce.  Educated patient  on insulin pen use at home. Reviewed contents of insulin flexpen starter kit. Reviewed all steps if insulin pen including attachment of needle, 2-unit air shot, dialing up dose, giving injection, removing needle, disposal of sharps, storage of unused insulin, disposal of insulin etc. Patient able to provide successful return demonstration. Also reviewed troubleshooting with insulin pen. MD to give patient Rxs for insulin pens and insulin pen needles. Anticipate need for insulin at discharge; benefits check in process.   Thanks, Lujean Rave, MSN, RNC-OB Diabetes Coordinator [phone] (8a-5p)

## 2022-10-05 NOTE — Evaluation (Signed)
Physical Therapy Evaluation Patient Details Name: Brendan Stone MRN: [id number] DOB: June 09, 1965 Today's Date: 10/05/2022  History of Present Illness  Brendan Stone is a 57 y.o. male who presents to the ED on 10/03/22 with persistent redness and swelling to his left leg for 1 month. PMH: DM2, obesity, rotator cuff repair, peripheral neuropathy  Clinical Impression  Pt doing well with mobility and no further PT needed.  Ready for dc from PT standpoint.        Recommendations for follow up therapy are one component of a multi-disciplinary discharge planning process, led by the attending physician.  Recommendations may be updated based on patient status, additional functional criteria and insurance authorization.  Follow Up Recommendations       Assistance Recommended at Discharge None  Patient can return home with the following       Equipment Recommendations None recommended by PT  Recommendations for Other Services       Functional Status Assessment Patient has not had a recent decline in their functional status     Precautions / Restrictions Precautions Precautions: None Restrictions Weight Bearing Restrictions: No      Mobility  Bed Mobility Overal bed mobility: Independent                  Transfers Overall transfer level: Independent Equipment used: None                    Ambulation/Gait Ambulation/Gait assistance: Modified independent (Device/Increase time) Gait Distance (Feet): 400 Feet Assistive device: None Gait Pattern/deviations: Step-through pattern, Decreased stride length, Wide base of support Gait velocity: decr Gait velocity interpretation: 1.31 - 2.62 ft/sec, indicative of limited community ambulator   General Gait Details: Steady gait  Stairs            Wheelchair Mobility    Modified Rankin (Stroke Patients Only)       Balance Overall balance assessment: Mild deficits observed, not formally tested                                            Pertinent Vitals/Pain Pain Assessment Pain Assessment: Faces Faces Pain Scale: Hurts a little bit Pain Location: RLE Pain Descriptors / Indicators: Discomfort, Sore Pain Intervention(s): Monitored during session    Home Living Family/patient expects to be discharged to:: Private residence Living Arrangements: Alone   Type of Home: House Home Access: Stairs to enter   Secretary/administrator of Steps: 1   Home Layout: One level Home Equipment: None      Prior Function Prior Level of Function : Independent/Modified Independent;Working/employed;Driving             Mobility Comments: Works 12 hr shifts at [employer]        Extremity/Trunk Assessment   Upper Extremity Assessment Upper Extremity Assessment: Overall WFL for tasks assessed    Lower Extremity Assessment Lower Extremity Assessment: RLE deficits/detail;LLE deficits/detail RLE Deficits / Details: Decr ROM due to edema RLE Sensation: history of peripheral neuropathy LLE Sensation: history of peripheral neuropathy       Communication   Communication: No difficulties  Cognition Arousal/Alertness: Awake/alert Behavior During Therapy: WFL for tasks assessed/performed Overall Cognitive Status: Within Functional Limits for tasks assessed  General Comments      Exercises     Assessment/Plan    PT Assessment Patient does not need any further PT services  PT Problem List         PT Treatment Interventions      PT Goals (Current goals can be found in the Care Plan section)  Acute Rehab PT Goals PT Goal Formulation: All assessment and education complete, DC therapy    Frequency       Co-evaluation               AM-PAC PT "6 Clicks" Mobility  Outcome Measure Help needed turning from your back to your side while in a flat bed without using bedrails?: None Help  needed moving from lying on your back to sitting on the side of a flat bed without using bedrails?: None Help needed moving to and from a bed to a chair (including a wheelchair)?: None Help needed standing up from a chair using your arms (e.g., wheelchair or bedside chair)?: None Help needed to walk in hospital room?: None Help needed climbing 3-5 steps with a railing? : None 6 Click Score: 24    End of Session   Activity Tolerance: Patient tolerated treatment well Patient left: in bed (sitting EOB)   PT Visit Diagnosis: Other abnormalities of gait and mobility (R26.89)    Time: 1610-9604 PT Time Calculation (min) (ACUTE ONLY): 21 min   Charges:   PT Evaluation $PT Eval Low Complexity: 1 Low          [facility] PT Acute Rehabilitation Services Office [phone]   Angelina Ok [facility] 10/05/2022, 5:06 PM

## 2022-10-05 NOTE — Progress Notes (Signed)
VASCULAR LAB    ABI has been performed.  See CV proc for preliminary results.   Aerin Delany, RVT 10/05/2022, 3:03 PM

## 2022-10-05 NOTE — TOC Initial Note (Signed)
Transition of Care [facility]) - Initial/Assessment Note    Patient Details  Name: Brendan Stone MRN: [id number] Date of Birth: Dec 03, 1965  Transition of Care [facility]) CM/SW Contact:    Harriet Masson, RN Phone Number: 10/05/2022, 4:30 PM  Clinical Narrative:                  Sherron Monday to patient regarding PCP needs.  Patient requesting apt at [facility].  CMA spoke to [facility] and they require patient to pick up a form to fill out to schedule apt for PCP.  Information added to AVS. Expected Discharge Plan: Home/Self Care Barriers to Discharge: Continued Medical Work up   Patient Goals and CMS Choice Patient states their goals for this hospitalization and ongoing recovery are:: return home          Expected Discharge Plan and Services       Living arrangements for the past 2 months: Single Family Home                                      Prior Living Arrangements/Services Living arrangements for the past 2 months: Single Family Home Lives with:: Self Patient language and need for interpreter reviewed:: Yes Do you feel safe going back to the place where you live?: Yes      Need for Family Participation in Patient Care: Yes (Comment) Care giver support system in place?: Yes (comment)   Criminal Activity/Legal Involvement Pertinent to Current Situation/Hospitalization: No - Comment as needed  Activities of Daily Living Home Assistive Devices/Equipment: Eyeglasses ADL Screening (condition at time of admission) Patient's cognitive ability adequate to safely complete daily activities?: Yes Is the patient deaf or have difficulty hearing?: No Does the patient have difficulty seeing, even when wearing glasses/contacts?: No Does the patient have difficulty concentrating, remembering, or making decisions?: No Patient able to express need for assistance with ADLs?: Yes Does the patient have difficulty dressing or bathing?: No Independently performs ADLs?: Yes  (appropriate for developmental age) Does the patient have difficulty walking or climbing stairs?: No Weakness of Legs: None Weakness of Arms/Hands: None  Permission Sought/Granted                  Emotional Assessment Appearance:: Appears stated age Attitude/Demeanor/Rapport: Gracious Affect (typically observed): Accepting Orientation: : Oriented to Self, Oriented to Place, Oriented to  Time, Oriented to Situation Alcohol / Substance Use: Not Applicable Psych Involvement: No (comment)  Admission diagnosis:  Cellulitis of left lower extremity [L03.116] Hyperglycemia due to diabetes mellitus (HCC) [E11.65] Patient Active Problem List   Diagnosis Date Noted   Cellulitis of left lower extremity 10/03/2022   Obesity (BMI 30.0-34.9) 03/01/2020   Diabetic nephropathy (HCC)    Hyperlipidemia    Exposure to asbestos 03/11/2019   Chronic cough 03/11/2019   Fatigue 03/11/2019   Excessive daytime sleepiness 03/11/2019   Poor sleep 03/11/2019   Type 2 diabetes mellitus with hyperglycemia, without long-term current use of insulin (HCC)    PCP:  Patient, No Pcp Per Pharmacy:   [facility] [address] [place] - [address] [place] [address] Phone: [phone] Fax: [phone]     Social Determinants of Health (SDOH) Social History: SDOH Screenings   Food Insecurity: No Food Insecurity (10/03/2022)  Housing: Low Risk  (10/03/2022)  Transportation Needs: No Transportation Needs (10/03/2022)  Utilities: Not At Risk (10/03/2022)  Depression (PHQ2-9): Low Risk  (06/13/2020)  Tobacco Use: Low Risk  (10/03/2022)   SDOH Interventions:     Readmission Risk Interventions     No data to display

## 2022-10-05 NOTE — Progress Notes (Signed)
Pharmacy Antibiotic Note  Brendan Stone is a 57 y.o. male for which pharmacy has been consulted for vancomycin dosing for cellulitis.  Patient with a history of DM. Patient presenting with LLE wounds and swelling x 2-4 weeks. 4/24 [place] negative for left LE DVT  SCr 0.90 WBC 7.2; afebrile   Plan: Change Vancomycin to 1000 q12 hours , used Scr 0.0.9, IBW, Vd 0.5  due to BMI 32 ,  eAUC 527.7   Trend WBC, Fever, Renal function F/u cultures, clinical course, WBC De-escalate when able  Height: 5\' 8"  (172.7 cm) Weight: 98.3 kg (216 lb 11.4 oz) IBW/kg (Calculated) : 68.4  Temp (24hrs), Avg:98.2 F (36.8 C), Min:98 F (36.7 C), Max:98.5 F (36.9 C)  Recent Labs  Lab 10/03/22 1621 10/04/22 0451 10/05/22 0438  WBC 7.2 6.2 7.2  CREATININE 0.97 0.78 0.90     Estimated Creatinine Clearance: 103 mL/min (by C-G formula based on SCr of 0.9 mg/dL).    Allergies  Allergen Reactions   Tape Rash   Microbiology results: 5/12 MRSA PCR: neg  Thank you for allowing pharmacy to be a part of this patient's care.  Noah Delaine, RPh Clinical Pharmacist 10/05/2022 3:11 PM  Please check AMION for all [facility] phone numbers After 10:00 PM, call Main Pharmacy [phone]

## 2022-10-05 NOTE — Progress Notes (Signed)
[name]:[id number] DOB: 27-Jul-1965 DOA: 10/03/2022 PCP: Patient, No Pcp Per  HPI/Recap of past 24 hours: Brendan Stone is a 57 y.o. male with medical history significant for type 2 diabetes who presented to the ED for evaluation of persistent redness and swelling to his left leg for 1 month.  Previously seen in the ED 4/23 and diagnosed with left lower extremity cellulitis.  He was discharged on a course of doxycycline which he completed.  He says he did see some improvement in the redness on his leg after completing antibiotics however this has returned.  He has had clear weeping of his left lower leg and is seeing blistering over the erythematous area of his left lower leg.     He is also having swelling of both of his legs.  Because of the swelling he has been wearing shoes without socks and has now seen blistering on the medial sides of both first toes.  He denies fevers, chills, diaphoresis, nausea, vomit, abdominal pain, chest pain, dyspnea.  He states that his primary care practitioner is no longer working and he currently has no PCP.  10/05/2022: Pain and erythema are improving.  Blood sugar is elevated, long-acting insulin dose increase.  Assessment/Plan: Principal Problem:   Cellulitis of left lower extremity Active Problems:   Type 2 diabetes mellitus with hyperglycemia, without long-term current use of insulin (HCC)  Cellulitis of left lower extremity, POA: Failed outpatient antibiotics.  Continue IV vancomycin, DC on 10/06/2022. Continue Rocephin to broaden coverage Follow MRSA screening test. Wound care specialist consulted, local wound care per their guidance.   Type 2 diabetes with hyperglycemia: Hyperglycemic without evidence of DKA/HHS.  Started on Semglee 10 units nightly plus SSI.  Holding metformin for now.  Hemoglobin A1c 12.3 on 10/12/2022. Increased dose of Semglee to 18 units daily.  Obesity BMI 32 Recommend weight loss outpatient with  regular physical activity and healthy dieting.  Physical debility PT assessment    DVT prophylaxis: enoxaparin (LOVENOX) injection 40 mg Start: 10/03/22 2230 Code Status: Full code, confirmed with patient on admission Family Communication: None at bedside. Disposition Plan: From home and likely discharge to home pending clinical progress Consults called: None   Status is: Inpatient The patient requires at least 2 midnights for further evaluation and treatment of present condition.    Objective: Vitals:   10/04/22 1627 10/04/22 2045 10/05/22 0406 10/05/22 0850  BP: 136/77 138/81 (!) 151/86 (!) 159/81  Pulse: 71 73 68 88  Resp: 17 18 16 18   Temp: 98.5 F (36.9 C) 98 F (36.7 C) 98.2 F (36.8 C) 98.2 F (36.8 C)  TempSrc:  Oral Oral Oral  SpO2: 97% 97% 98% 97%  Weight:      Height:        Intake/Output Summary (Last 24 hours) at 10/05/2022 1612 Last data filed at 10/05/2022 0435 Gross per 24 hour  Intake 387.09 ml  Output --  Net 387.09 ml   Filed Weights   10/03/22 1601 10/03/22 2259  Weight: 99.8 kg 98.3 kg    Exam:  General: 57 y.o. year-old male well-developed well-nourished in no acute distress.  He is alert oriented x 3.   Cardiovascular: Regular rate and rhythm. Respiratory: Clear to auscultation with no wheezes or rales. Good inspiratory effort. Abdomen: Soft nontender nondistended with normal bowel sounds x4 quadrants. Musculoskeletal:  Skin: Psychiatry: Mood is appropriate for condition and setting   Data Reviewed: CBC: Recent Labs  Lab 10/03/22  1621 10/04/22 0451 10/05/22 0438  WBC 7.2 6.2 7.2  HGB 13.4 12.4* 13.5  HCT 39.9 36.7* 40.9  MCV 86.0 83.2 86.3  PLT 259 217 234   Basic Metabolic Panel: Recent Labs  Lab 10/03/22 1621 10/04/22 0451 10/05/22 0438  NA 136 137 135  K 3.9 3.7 3.7  CL 102 107 103  CO2 24 24 23   GLUCOSE 341* 272* 189*  BUN 10 9 14   CREATININE 0.97 0.78 0.90  CALCIUM 8.8* 8.4* 8.3*  MG  --   --  2.0    GFR: Estimated Creatinine Clearance: 103 mL/min (by C-G formula based on SCr of 0.9 mg/dL). Liver Function Tests: Recent Labs  Lab 10/03/22 1621  AST 17  ALT 22  ALKPHOS 80  BILITOT 0.5  PROT 6.9  ALBUMIN 3.5   No results for input(s): "LIPASE", "AMYLASE" in the last 168 hours. No results for input(s): "AMMONIA" in the last 168 hours. Coagulation Profile: No results for input(s): "INR", "PROTIME" in the last 168 hours. Cardiac Enzymes: No results for input(s): "CKTOTAL", "CKMB", "CKMBINDEX", "TROPONINI" in the last 168 hours. BNP (last 3 results) No results for input(s): "PROBNP" in the last 8760 hours. HbA1C: Recent Labs    10/04/22 0451  HGBA1C 12.3*   CBG: Recent Labs  Lab 10/04/22 1252 10/04/22 1628 10/04/22 2101 10/05/22 0852 10/05/22 1210  GLUCAP 211* 207* 240* 289* 220*   Lipid Profile: No results for input(s): "CHOL", "HDL", "LDLCALC", "TRIG", "CHOLHDL", "LDLDIRECT" in the last 72 hours. Thyroid Function Tests: No results for input(s): "TSH", "T4TOTAL", "FREET4", "T3FREE", "THYROIDAB" in the last 72 hours. Anemia Panel: No results for input(s): "VITAMINB12", "FOLATE", "FERRITIN", "TIBC", "IRON", "RETICCTPCT" in the last 72 hours. Urine analysis:    Component Value Date/Time   COLORURINE STRAW (A) 10/03/2022 1635   APPEARANCEUR CLEAR 10/03/2022 1635   LABSPEC 1.009 10/03/2022 1635   PHURINE 7.0 10/03/2022 1635   GLUCOSEU >=500 (A) 10/03/2022 1635   HGBUR NEGATIVE 10/03/2022 1635   BILIRUBINUR NEGATIVE 10/03/2022 1635   KETONESUR NEGATIVE 10/03/2022 1635   PROTEINUR NEGATIVE 10/03/2022 1635   UROBILINOGEN 0.2 02/01/2009 0809   NITRITE NEGATIVE 10/03/2022 1635   LEUKOCYTESUR NEGATIVE 10/03/2022 1635   Sepsis Labs: @LABRCNTIP (procalcitonin:4,lacticidven:4)  ) Recent Results (from the past 240 hour(s))  MRSA Next Gen by PCR, Nasal     Status: None   Collection Time: 10/04/22  6:26 PM   Specimen: Nasal Mucosa; Nasal Swab  Result Value Ref Range  Status   MRSA by PCR Next Gen NOT DETECTED NOT DETECTED Final    Comment: (NOTE) The GeneXpert MRSA Assay (FDA approved for NASAL specimens only), is one component of a comprehensive MRSA colonization surveillance program. It is not intended to diagnose MRSA infection nor to guide or monitor treatment for MRSA infections. Test performance is not FDA approved in patients less than 23 years old. Performed at [facility], [address], [place], [place] [address]       Studies: VAS [place] ABI WITH/WO TBI  Result Date: 10/05/2022  LOWER EXTREMITY DOPPLER STUDY Patient Name:  KHYRAN ENGBERG  Date of Exam:   10/05/2022 Medical Rec #: [id number]      Accession #:    [id number] Date of Birth: 07-Sep-1965       Patient Gender: M Patient Age:   30 years Exam Location:  [facility] Procedure:      VAS [place] ABI WITH/WO TBI Referring Phys: Lisabeth Mian --------------------------------------------------------------------------------  Indications: Ulceration. Cellulitis, weeping, blistering High  Risk Factors: Hyperlipidemia, Diabetes. Other Factors: Venous stasis.  Comparison Study: No prior study Performing Technologist: Sherren Kerns RVS  Examination Guidelines: A complete evaluation includes at minimum, Doppler waveform signals and systolic blood pressure reading at the level of bilateral brachial, anterior tibial, and posterior tibial arteries, when vessel segments are accessible. Bilateral testing is considered an integral part of a complete examination. Photoelectric Plethysmograph (PPG) waveforms and toe systolic pressure readings are included as required and additional duplex testing as needed. Limited examinations for reoccurring indications may be performed as noted.  ABI Findings: +---------+------------------+-----+-----------+--------+ Right    Rt Pressure (mmHg)IndexWaveform   Comment  +---------+------------------+-----+-----------+--------+ Brachial 159                    triphasic            +---------+------------------+-----+-----------+--------+ PTA      189               1.16 multiphasic         +---------+------------------+-----+-----------+--------+ DP       193               1.18 multiphasic         +---------+------------------+-----+-----------+--------+ Great Toe165               1.01 Normal              +---------+------------------+-----+-----------+--------+ +---------+------------------+-----+-----------+-------+ Left     Lt Pressure (mmHg)IndexWaveform   Comment +---------+------------------+-----+-----------+-------+ Brachial 163                    triphasic          +---------+------------------+-----+-----------+-------+ PTA      188               1.15 multiphasic        +---------+------------------+-----+-----------+-------+ DP       195               1.20 multiphasic        +---------+------------------+-----+-----------+-------+ Great Toe105               0.64 Normal             +---------+------------------+-----+-----------+-------+ +-------+-----------+-----------+------------+------------+ ABI/TBIToday's ABIToday's TBIPrevious ABIPrevious TBI +-------+-----------+-----------+------------+------------+ Right  1.18       1.20                                +-------+-----------+-----------+------------+------------+ Left   1.01       0.64                                +-------+-----------+-----------+------------+------------+   Summary: Right: Resting right ankle-brachial index is within normal range. The right toe-brachial index is normal. Left: Resting left ankle-brachial index is within normal range. The left toe-brachial index is normal. *See table(s) above for measurements and observations.  Electronically signed by Sherald Hess MD on 10/05/2022 at 3:16:38 PM.    Final     Scheduled Meds:  enoxaparin (LOVENOX) injection  40 mg Subcutaneous Q24H   insulin aspart  0-5 Units Subcutaneous QHS    insulin aspart  0-9 Units Subcutaneous TID WC   insulin glargine-yfgn  10 Units Subcutaneous QHS    Continuous Infusions:  cefTRIAXone (ROCEPHIN)  IV Stopped (10/04/22 1831)   vancomycin       LOS: 1 day  Darlin Drop, MD [facility] Pager [phone]  If 7PM-7AM, please contact night-coverage [email or web address] Password [facility] 10/05/2022, 4:12 PM

## 2022-10-05 NOTE — Consult Note (Signed)
WOC Nurse Consult Note: Reason for Consult: LE wounds Patient with history of cellulitis, presents with worsening blistering and erythema of the LEs Blistering on the bilateral great toes medially, reported from wearing shoes without socks due to edema  Wound type: Cellulitis with most likely underlying venous stasis.  Pressure Injury POA: NA Measurement:scattered serous blistering LLE; less than 1cm in diameter serous blistering on the toes Wound bed: see above Drainage (amount, consistency, odor) none documented Periwound: erythema; edema   Dressing procedure/placement/frequency: Cover open weeping areas with single layer of xeroform gauze. Wrap with kerlix and 4" ACE wrap from toes to knees, important to wrap from toes to knee for edema management  Consider ABIs to work up safety for therapeutic compression such as Unna's boots. Could consider bilateral compression as both LEs are edematous.   FU in [facility] of patient's choice for long term management of venous stasis dx.    Re consult if needed, will not follow at this time. Thanks  Falisha Osment [employer], RN,CWOCN, CNS, CWON-AP [phone])

## 2022-10-06 DIAGNOSIS — L03116 Cellulitis of left lower limb: Secondary | ICD-10-CM | POA: Diagnosis not present

## 2022-10-06 LAB — GLUCOSE, CAPILLARY
Glucose-Capillary: 185 mg/dL — ABNORMAL HIGH (ref 70–99)
Glucose-Capillary: 173 mg/dL — ABNORMAL HIGH (ref 70–99)
Glucose-Capillary: 250 mg/dL — ABNORMAL HIGH (ref 70–99)
Glucose-Capillary: 197 mg/dL — ABNORMAL HIGH (ref 70–99)

## 2022-10-06 MED ORDER — INSULIN GLARGINE-YFGN 100 UNIT/ML ~~LOC~~ SOLN
15.0000 [IU] | Freq: Every day | SUBCUTANEOUS | Status: DC
  Administered 2022-10-06: 15 [IU] via SUBCUTANEOUS
  Filled 2022-10-06 (×2): qty 0.15

## 2022-10-06 MED ORDER — VANCOMYCIN HCL IN DEXTROSE 1-5 GM/200ML-% IV SOLN
1000.0000 mg | Freq: Once | INTRAVENOUS | Status: AC
  Administered 2022-10-06: 1000 mg via INTRAVENOUS
  Filled 2022-10-06: qty 200

## 2022-10-06 MED ORDER — INSULIN ASPART 100 UNIT/ML IJ SOLN
4.0000 [IU] | Freq: Three times a day (TID) | INTRAMUSCULAR | Status: DC
  Administered 2022-10-06 – 2022-10-08 (×6): 4 [IU] via SUBCUTANEOUS

## 2022-10-06 NOTE — Plan of Care (Signed)
Nutrition Education Note:   RD consulted for nutrition education regarding diabetes.   Lab Results  Component Value Date   HGBA1C 12.3 (H) 10/04/2022    RD provided "Carbohydrate Counting for People with Diabetes" handout from the Academy of Nutrition and Dietetics. Discussed different food groups and their effects on blood sugar, emphasizing carbohydrate-containing foods. Provided list of carbohydrates and recommended serving sizes of common foods.  Discussed importance of controlled and consistent carbohydrate intake throughout the day. Provided examples of ways to balance meals/snacks and encouraged intake of high-fiber, whole grain complex carbohydrates. Teach back method used.  Expect fair compliance.  Body mass index is 32.95 kg/m. Pt meets criteria for obese based on current BMI.  Current diet order is heart healthy/CHO mod, patient is consuming approximately 100% of meals at this time. Labs and medications reviewed. No further nutrition interventions warranted at this time. RD contact information provided. If additional nutrition issues arise, please re-consult RD.  Bethann Humble, RD, LDN, CNSC.

## 2022-10-06 NOTE — Progress Notes (Signed)
Mobility Specialist Progress Note   10/06/22 1116  Mobility  Activity Ambulated independently in hallway  Level of Assistance Independent after set-up  Assistive Device None  Distance Ambulated (ft) 500 ft  Activity Response Tolerated well  Mobility Referral Yes  $Mobility charge 1 Mobility  Mobility Specialist Start Time (ACUTE ONLY) 1100  Mobility Specialist Stop Time (ACUTE ONLY) 1116  Mobility Specialist Time Calculation (min) (ACUTE ONLY) 16 min   Patient received bed agreeable to participate in mobility. Ambulated in hallway independently with steady gait. Returned to room without complaint or incident. Was left back in bed with all needs met, call bell in reach.   Frederico Hamman Mobility Specialist Please contact via SecureChat or  Rehab office at [phone]

## 2022-10-06 NOTE — Progress Notes (Signed)
PROGRESS NOTE  Brendan Stone ZOX:[id number] DOB: 1965/06/24 DOA: 10/03/2022 PCP: Patient, No Pcp Per  HPI/Recap of past 24 hours: Brendan Stone is a 57 y.o. male with medical history significant for type 2 diabetes who presented to the ED for evaluation of persistent redness and swelling to his left leg for 1 month.  Previously seen in the ED 4/23 and diagnosed with left lower extremity cellulitis.  He was discharged on a course of doxycycline which he completed.  He says he did see some improvement in the redness on his leg after completing antibiotics however this has returned.  He has had clear weeping of his left lower leg and is seeing blistering over the erythematous area of his left lower leg.  Workup revealed left lower extremity cellulitis.  Wound care specialist was also consulted for local wound care.  Received 3 doses of IV vancomycin.  MRSA screen test returned negative.  10/06/2022: Seen at his bedside.  There were no acute events overnight.  The patient has no new complaints.  Plan is to continue IV antibiotics today and plan to discharge tomorrow on oral antibiotics, to follow-up with outpatient wound care and a PCP.  Assessment/Plan: Principal Problem:   Cellulitis of left lower extremity Active Problems:   Type 2 diabetes mellitus with hyperglycemia, without long-term current use of insulin (HCC)  Cellulitis of left lower extremity, POA: Failed outpatient therapy.  Received IV vancomycin, DC'd on 10/06/2022. MRSA screening test negative. Continue Rocephin. Wound care specialist consulted Continue local wound care.   Type 2 diabetes with hyperglycemia: Hyperglycemic without evidence of DKA/HHS.  Started on Semglee 10 units nightly, dose increased to 15 units nightly.   Added NovoLog 4 units 3 times daily Appreciate diabetes coordinator. Continue insulin sliding scale  Continue to hold off home oral hypoglycemics.  Hemoglobin A1c 12.3 on 10/04/2022.  Obesity BMI  32 Recommend weight loss outpatient with regular physical activity and healthy dieting.  Physical debility PT assessment, no follow-up recommended.    DVT prophylaxis: enoxaparin (LOVENOX) injection 40 mg Start: 10/03/22 2230 Code Status: Full code, confirmed with patient on admission Family Communication: None at bedside. Disposition Plan: From home and likely discharge to home pending clinical progress Consults called: None   Status is: Inpatient The patient requires at least 2 midnights for further evaluation and treatment of present condition.    Objective: Vitals:   10/05/22 1726 10/05/22 2034 10/06/22 0501 10/06/22 0836  BP: (!) 166/89 135/74 (!) 145/82 (!) 157/92  Pulse: 78 76 69 70  Resp: 18 18 18 18   Temp: 97.9 F (36.6 C) 98.5 F (36.9 C) (!) 97.3 F (36.3 C) 98.6 F (37 C)  TempSrc: Oral  Oral Oral  SpO2: 98% 95% 98% 97%  Weight:      Height:        Intake/Output Summary (Last 24 hours) at 10/06/2022 1550 Last data filed at 10/05/2022 2033 Gross per 24 hour  Intake 120 ml  Output --  Net 120 ml   Filed Weights   10/03/22 1601 10/03/22 2259  Weight: 99.8 kg 98.3 kg    Exam:  General: 58 y.o. year-old male well-developed well-nourished in no acute distress.  He is alert and oriented x 3. Cardiovascular: Regular rate and rhythm no rubs or gallops. Respiratory: Clear to auscultation no wheezes or rales. Abdomen: Soft nontender nondistended with normal bowel sounds x4 quadrants. Musculoskeletal:  Skin: Psychiatry: Mood is appropriate for condition and setting   Data Reviewed: CBC: Recent  Labs  Lab 10/03/22 1621 10/04/22 0451 10/05/22 0438  WBC 7.2 6.2 7.2  HGB 13.4 12.4* 13.5  HCT 39.9 36.7* 40.9  MCV 86.0 83.2 86.3  PLT 259 217 234   Basic Metabolic Panel: Recent Labs  Lab 10/03/22 1621 10/04/22 0451 10/05/22 0438  NA 136 137 135  K 3.9 3.7 3.7  CL 102 107 103  CO2 24 24 23   GLUCOSE 341* 272* 189*  BUN 10 9 14   CREATININE 0.97  0.78 0.90  CALCIUM 8.8* 8.4* 8.3*  MG  --   --  2.0   GFR: Estimated Creatinine Clearance: 103 mL/min (by C-G formula based on SCr of 0.9 mg/dL). Liver Function Tests: Recent Labs  Lab 10/03/22 1621  AST 17  ALT 22  ALKPHOS 80  BILITOT 0.5  PROT 6.9  ALBUMIN 3.5   No results for input(s): "LIPASE", "AMYLASE" in the last 168 hours. No results for input(s): "AMMONIA" in the last 168 hours. Coagulation Profile: No results for input(s): "INR", "PROTIME" in the last 168 hours. Cardiac Enzymes: No results for input(s): "CKTOTAL", "CKMB", "CKMBINDEX", "TROPONINI" in the last 168 hours. BNP (last 3 results) No results for input(s): "PROBNP" in the last 8760 hours. HbA1C: Recent Labs    10/04/22 0451  HGBA1C 12.3*   CBG: Recent Labs  Lab 10/05/22 1210 10/05/22 1725 10/05/22 2031 10/06/22 0836 10/06/22 1204  GLUCAP 220* 336* 297* 185* 173*   Lipid Profile: No results for input(s): "CHOL", "HDL", "LDLCALC", "TRIG", "CHOLHDL", "LDLDIRECT" in the last 72 hours. Thyroid Function Tests: No results for input(s): "TSH", "T4TOTAL", "FREET4", "T3FREE", "THYROIDAB" in the last 72 hours. Anemia Panel: No results for input(s): "VITAMINB12", "FOLATE", "FERRITIN", "TIBC", "IRON", "RETICCTPCT" in the last 72 hours. Urine analysis:    Component Value Date/Time   COLORURINE STRAW (A) 10/03/2022 1635   APPEARANCEUR CLEAR 10/03/2022 1635   LABSPEC 1.009 10/03/2022 1635   PHURINE 7.0 10/03/2022 1635   GLUCOSEU >=500 (A) 10/03/2022 1635   HGBUR NEGATIVE 10/03/2022 1635   BILIRUBINUR NEGATIVE 10/03/2022 1635   KETONESUR NEGATIVE 10/03/2022 1635   PROTEINUR NEGATIVE 10/03/2022 1635   UROBILINOGEN 0.2 02/01/2009 0809   NITRITE NEGATIVE 10/03/2022 1635   LEUKOCYTESUR NEGATIVE 10/03/2022 1635   Sepsis Labs: @LABRCNTIP (procalcitonin:4,lacticidven:4)  ) Recent Results (from the past 240 hour(s))  MRSA Next Gen by PCR, Nasal     Status: None   Collection Time: 10/04/22  6:26 PM    Specimen: Nasal Mucosa; Nasal Swab  Result Value Ref Range Status   MRSA by PCR Next Gen NOT DETECTED NOT DETECTED Final    Comment: (NOTE) The GeneXpert MRSA Assay (FDA approved for NASAL specimens only), is one component of a comprehensive MRSA colonization surveillance program. It is not intended to diagnose MRSA infection nor to guide or monitor treatment for MRSA infections. Test performance is not FDA approved in patients less than 44 years old. Performed at [facility], [address], [place], [place] [address]       Studies: No results found.  Scheduled Meds:  enoxaparin (LOVENOX) injection  40 mg Subcutaneous Q24H   insulin aspart  0-5 Units Subcutaneous QHS   insulin aspart  0-9 Units Subcutaneous TID WC   insulin aspart  4 Units Subcutaneous TID WC   insulin glargine-yfgn  15 Units Subcutaneous QHS    Continuous Infusions:  cefTRIAXone (ROCEPHIN)  IV 2 g (10/05/22 1831)     LOS: 2 days     Darlin Drop, MD [facility] Pager [phone]  If 7PM-7AM, please contact night-coverage [email or web address] Password [email or web address] 10/06/2022, 3:50 PM

## 2022-10-06 NOTE — Inpatient Diabetes Management (Addendum)
Inpatient Diabetes Program Recommendations  AACE/ADA: New Consensus Statement on Inpatient Glycemic Control (2015)  Target Ranges:  Prepandial:   less than 140 mg/dL      Peak postprandial:   less than 180 mg/dL (1-2 hours)      Critically ill patients:  140 - 180 mg/dL   Lab Results  Component Value Date   GLUCAP 185 (H) 10/06/2022   HGBA1C 12.3 (H) 10/04/2022    Diabetes history: Type 2 DM Outpatient Diabetes medications: Metformin 1000 mg BID Current orders for Inpatient glycemic control: Semglee 10 units QD, Novolog 0-9 units TID & HS   Inpatient Diabetes Program Recommendations:     Consider increasing Semglee to 16 units QD and adding Novolog 4 units TID (assuming patient consuming >50% of meals)  Lantus preferred basal-insulin pen $50 [employer]bels[employer] requires prior authorization  Spoke with patient again to further reinforce DM. Patient feels comfortable with insulin pen. Freestyle libre 3 applied to patient's left upper arm and reviewed how to use, obtain additional sensors, and share date with providers. Patient plans to have outpatient PCP appointment in the next two weeks.  Reviewed basic survival skills, current dosages, possible plan for discharge (to include lantus), interventions, and target goals. No further questions at this time. Patient able to afford.   Thanks, Lujean Rave, MSN, RNC-OB Diabetes Coordinator [phone] (8a-5p)

## 2022-10-07 DIAGNOSIS — E1165 Type 2 diabetes mellitus with hyperglycemia: Secondary | ICD-10-CM

## 2022-10-07 DIAGNOSIS — L03116 Cellulitis of left lower limb: Secondary | ICD-10-CM | POA: Diagnosis not present

## 2022-10-07 LAB — GLUCOSE, CAPILLARY
Glucose-Capillary: 193 mg/dL — ABNORMAL HIGH (ref 70–99)
Glucose-Capillary: 129 mg/dL — ABNORMAL HIGH (ref 70–99)
Glucose-Capillary: 249 mg/dL — ABNORMAL HIGH (ref 70–99)
Glucose-Capillary: 159 mg/dL — ABNORMAL HIGH (ref 70–99)

## 2022-10-07 LAB — CBC
HCT: 42.2 % (ref 39.0–52.0)
Hemoglobin: 14.3 g/dL (ref 13.0–17.0)
MCH: 28.2 pg (ref 26.0–34.0)
MCHC: 33.9 g/dL (ref 30.0–36.0)
MCV: 83.2 fL (ref 80.0–100.0)
Platelets: 261 10*3/uL (ref 150–400)
RBC: 5.07 MIL/uL (ref 4.22–5.81)
RDW: 12.5 % (ref 11.5–15.5)
WBC: 7.5 10*3/uL (ref 4.0–10.5)
nRBC: 0 % (ref 0.0–0.2)

## 2022-10-07 LAB — BASIC METABOLIC PANEL
Anion gap: 10 (ref 5–15)
BUN: 14 mg/dL (ref 6–20)
CO2: 25 mmol/L (ref 22–32)
Calcium: 9.2 mg/dL (ref 8.9–10.3)
Chloride: 102 mmol/L (ref 98–111)
Creatinine, Ser: 0.84 mg/dL (ref 0.61–1.24)
GFR, Estimated: >60 mL/min (ref >60)
Glucose, Bld: 200 mg/dL — ABNORMAL HIGH (ref 70–99)
Potassium: 4 mmol/L (ref 3.5–5.1)
Sodium: 137 mmol/L (ref 135–145)

## 2022-10-07 MED ORDER — INSULIN GLARGINE-YFGN 100 UNIT/ML ~~LOC~~ SOLN
16.0000 [IU] | Freq: Every day | SUBCUTANEOUS | Status: DC
  Administered 2022-10-07: 16 [IU] via SUBCUTANEOUS
  Filled 2022-10-07 (×2): qty 0.16

## 2022-10-07 MED ORDER — FUROSEMIDE 40 MG PO TABS
40.0000 mg | ORAL_TABLET | Freq: Once | ORAL | Status: AC
  Administered 2022-10-07: 40 mg via ORAL
  Filled 2022-10-07: qty 1

## 2022-10-07 NOTE — Plan of Care (Signed)

## 2022-10-07 NOTE — Progress Notes (Signed)
[facility]                                                                              Brendan Stone, is a 57 y.o. male, DOB - 09/05/1965, ZOX:[id number] Admit date - 10/03/2022    Outpatient Primary MD for the patient is Patient, No Pcp Per  LOS - 3  days  Chief Complaint  Patient presents with   Leg Pain       Brief summary   Patient is a 57 y.o. male with type 2 diabetes who presented to the ED for evaluation of persistent redness and swelling to his left leg for 1 month.  Previously seen in the ED 4/23 and diagnosed with left lower extremity cellulitis.  He was discharged on a course of doxycycline which he completed.  He says he did see some improvement in the redness on his leg after completing antibiotics however this has returned.  He has had clear weeping of his left lower leg and is seeing blistering over the erythematous area of his left lower leg.  Workup revealed left lower extremity cellulitis.  Wound care specialist was also consulted for local wound care.  Received 3 doses of IV vancomycin.  MRSA screen test returned negative.    Assessment & Plan    Principal Problem:   Cellulitis of left lower extremity, outpatient failure to treatment, POA -Started on IV vancomycin and Rocephin.  Vancomycin DC'd on 5/14, received 3 doses.  ABIs negative. -Continue IV Rocephin, improving. -Continue local wound care -Will give 1 dose of Lasix 40 mg p.o.   Active Problems:   Type 2 diabetes mellitus with hyperglycemia, without long-term current use of insulin (HCC) -Hold oral hypoglycemics -Hemoglobin A1c 12.3 on 10/04/2022 CBG (last 3)  Recent Labs    10/06/22 1941 10/07/22 0836 10/07/22 1313  GLUCAP 197* 193* 129*   -Increase Semglee 16 units daily, NovoLog 4 units 3 times daily AC, sensitive SSI     Obesity Estimated body mass index is 32.95 kg/m as calculated from the following:   Height as of this encounter: 5\' 8"  (1.727 m).   Weight as of this  encounter: 98.3 kg.  Code Status: Full code DVT Prophylaxis:  enoxaparin (LOVENOX) injection 40 mg Start: 10/04/22 1400   Level of Care: Level of care: Med-Surg Family Communication: Updated patient Disposition Plan:      Remains inpatient appropriate: Hopefully DC home tomorrow   Procedures:    Consultants:     Antimicrobials:   Anti-infectives (From admission, onward)    Start     Dose/Rate Route Frequency Ordered Stop   10/06/22 1130  vancomycin (VANCOCIN) IVPB 1000 mg/200 mL premix        1,000 mg 200 mL/hr over 60 Minutes Intravenous  Once 10/06/22 1122 10/06/22 1331   10/05/22 2300  vancomycin (VANCOCIN) IVPB 1000 mg/200 mL premix  Status:  Discontinued        1,000 mg 200 mL/hr over 60 Minutes Intravenous Every 12 hours 10/05/22 1453 10/06/22 1122   10/04/22 1900  vancomycin (VANCOREADY) IVPB 1500 mg/300 mL  Status:  Discontinued  1,500 mg 150 mL/hr over 120 Minutes Intravenous Every 12 hours 10/04/22 1301 10/05/22 1453   10/04/22 1845  vancomycin (VANCOREADY) IVPB 1750 mg/350 mL  Status:  Discontinued       See Hyperspace for full Linked Orders Report.   1,750 mg 175 mL/hr over 120 Minutes Intravenous Every 24 hours 10/03/22 1845 10/04/22 1300   10/04/22 1845  cefTRIAXone (ROCEPHIN) 2 g in sodium chloride 0.9 % 100 mL IVPB        2 g 200 mL/hr over 30 Minutes Intravenous Every 24 hours 10/04/22 1747     10/03/22 1845  vancomycin (VANCOREADY) IVPB 2000 mg/400 mL       See Hyperspace for full Linked Orders Report.   2,000 mg 200 mL/hr over 120 Minutes Intravenous  Once 10/03/22 1845 10/03/22 2218          Medications  enoxaparin (LOVENOX) injection  40 mg Subcutaneous Q24H   insulin aspart  0-5 Units Subcutaneous QHS   insulin aspart  0-9 Units Subcutaneous TID WC   insulin aspart  4 Units Subcutaneous TID WC   insulin glargine-yfgn  15 Units Subcutaneous QHS      Subjective:   Brendan Stone was seen and examined today.  Feels he is improving,  left leg is still somewhat more swollen than the right, however the cellulitis improving.  No chest pain, shortness of breath, dizziness, abdominal pain.    Objective:   Vitals:   10/06/22 1648 10/06/22 1957 10/07/22 0444 10/07/22 0834  BP: 129/68 135/79 125/76 134/77  Pulse: 80 72 69 78  Resp: 18 18 16 18   Temp:  97.8 F (36.6 C) 97.9 F (36.6 C) 98.2 F (36.8 C)  TempSrc:      SpO2: 92% 99% 98% 98%  Weight:      Height:       No intake or output data in the 24 hours ending 10/07/22 1440   Wt Readings from Last 3 Encounters:  10/03/22 98.3 kg  09/15/22 99.8 kg  06/13/20 97.7 kg     Exam General: Alert and oriented x 3, NAD Cardiovascular: S1 S2 auscultated,  RRR Respiratory: Clear to auscultation bilaterally, no wheezing, rales or rhonchi Gastrointestinal: Soft, nontender, nondistended, + bowel sounds Ext: no pedal edema bilaterally Neuro: Strength 5/5 upper and lower extremities bilaterally Skin: Left leg cellulitis, see below Psych: Normal affect        Data Reviewed:  I have personally reviewed following labs    CBC Lab Results  Component Value Date   WBC 7.5 10/07/2022   RBC 5.07 10/07/2022   HGB 14.3 10/07/2022   HCT 42.2 10/07/2022   MCV 83.2 10/07/2022   MCH 28.2 10/07/2022   PLT 261 10/07/2022   MCHC 33.9 10/07/2022   RDW 12.5 10/07/2022   LYMPHSABS 1.8 09/15/2022   MONOABS 0.7 09/15/2022   EOSABS 0.3 09/15/2022   BASOSABS 0.1 09/15/2022     Last metabolic panel Lab Results  Component Value Date   NA 137 10/07/2022   K 4.0 10/07/2022   CL 102 10/07/2022   CO2 25 10/07/2022   BUN 14 10/07/2022   CREATININE 0.84 10/07/2022   GLUCOSE 200 (H) 10/07/2022   GFRNONAA >60 10/07/2022   GFRAA 110 06/13/2020   CALCIUM 9.2 10/07/2022   PROT 6.9 10/03/2022   ALBUMIN 3.5 10/03/2022   LABGLOB 3.0 06/13/2020   AGRATIO 1.4 06/13/2020   BILITOT 0.5 10/03/2022   ALKPHOS 80 10/03/2022   AST 17 10/03/2022   ALT 22  10/03/2022   ANIONGAP 10  10/07/2022    CBG (last 3)  Recent Labs    10/06/22 1941 10/07/22 0836 10/07/22 1313  GLUCAP 197* 193* 129*      Coagulation Profile: No results for input(s): "INR", "PROTIME" in the last 168 hours.   Radiology Studies: I have personally reviewed the imaging studies  VAS [place] ABI WITH/WO TBI  Result Date: 10/05/2022  LOWER EXTREMITY DOPPLER STUDY Patient Name:  Brendan Stone  Date of Exam:   10/05/2022 Medical Rec #: [id number]      Accession #:    [id number] Date of Birth: 1966/03/01       Patient Gender: M Patient Age:   72 years Exam [facility]  [facility] Procedure:      VAS [place] ABI WITH/WO TBI Referring Phys: CAROLE HALL --------------------------------------------------------------------------------  Indications: Ulceration. Cellulitis, weeping, blistering High Risk Factors: Hyperlipidemia, Diabetes. Other Factors: Venous stasis.  Comparison Study: No prior study Performing Technologist: Sherren Kerns RVS  Examination Guidelines: A complete evaluation includes at minimum, Doppler waveform signals and systolic blood pressure reading at the level of bilateral brachial, anterior tibial, and posterior tibial arteries, when vessel segments are accessible. Bilateral testing is considered an integral part of a complete examination. Photoelectric Plethysmograph (PPG) waveforms and toe systolic pressure readings are included as required and additional duplex testing as needed. Limited examinations for reoccurring indications may be performed as noted.  ABI Findings: +---------+------------------+-----+-----------+--------+ Right    Rt Pressure (mmHg)IndexWaveform   Comment  +---------+------------------+-----+-----------+--------+ Brachial 159                    triphasic           +---------+------------------+-----+-----------+--------+ PTA      189               1.16 multiphasic         +---------+------------------+-----+-----------+--------+ DP       193                1.18 multiphasic         +---------+------------------+-----+-----------+--------+ Great Toe165               1.01 Normal              +---------+------------------+-----+-----------+--------+ +---------+------------------+-----+-----------+-------+ Left     Lt Pressure (mmHg)IndexWaveform   Comment +---------+------------------+-----+-----------+-------+ Brachial 163                    triphasic          +---------+------------------+-----+-----------+-------+ PTA      188               1.15 multiphasic        +---------+------------------+-----+-----------+-------+ DP       195               1.20 multiphasic        +---------+------------------+-----+-----------+-------+ Great Toe105               0.64 Normal             +---------+------------------+-----+-----------+-------+ +-------+-----------+-----------+------------+------------+ ABI/TBIToday's ABIToday's TBIPrevious ABIPrevious TBI +-------+-----------+-----------+------------+------------+ Right  1.18       1.20                                +-------+-----------+-----------+------------+------------+ Left   1.01       0.64                                +-------+-----------+-----------+------------+------------+  Summary: Right: Resting right ankle-brachial index is within normal range. The right toe-brachial index is normal. Left: Resting left ankle-brachial index is within normal range. The left toe-brachial index is normal. *See table(s) above for measurements and observations.  Electronically signed by Sherald Hess MD on 10/05/2022 at 3:16:38 PM.    Final        Thad Ranger M.D. [facility] 10/07/2022, 2:40 PM  Available via Epic secure chat 7am-7pm After 7 pm, please refer to night coverage provider listed on amion.

## 2022-10-07 NOTE — Consult Note (Signed)
   [facility] CM Inpatient Consult   10/07/2022  Bejan Jacobs [facility] 12/19/1965 [id number]  Late entry for 10/06/22   [facility] [[facility]]  Accountable Care Organization [ACO] Patient: [employer] PPO  Referral:  from Lujean Rave, Inpatient Diabetes Coordinator  Primary Care Provider:  Patient, No Pcp Per (Patient wants [facility] for PCP)  Patient evaluated for community based care coordination services with [facility] [[facility]] Care Management Program as a benefit  of patient's Insurance plan.  Met with patient at bedside on 10/06/22 to explain [facility] Care Management services.  Patient will receive post hospital discharge call and for assessments fo community needs for care/disease management.  Talked with patient at length regarding post hospital follow up for diabetes, nutritional needs and follow up with PCP.  He states his previous provider left the office at [facility] and did not have a provider taking new patients.     Plan: Continue to follow up.   Will make referral for assistance with provider follow up for diabetes and care coordination.  Of note, [facility] Care Management services does not replace or interfere with any services that are arranged by inpatient Transition of Care [TOC] team     For additional questions or referrals please contact:    Charlesetta Shanks, RN BSN CCM [facility]  [phone] business mobile phone Toll free office [phone]  *Concierge Line  [phone] Fax number: [phone] [email or web address]

## 2022-10-08 DIAGNOSIS — L03116 Cellulitis of left lower limb: Secondary | ICD-10-CM | POA: Diagnosis not present

## 2022-10-08 DIAGNOSIS — E1165 Type 2 diabetes mellitus with hyperglycemia: Secondary | ICD-10-CM | POA: Diagnosis not present

## 2022-10-08 LAB — GLUCOSE, CAPILLARY
Glucose-Capillary: 190 mg/dL — ABNORMAL HIGH (ref 70–99)
Glucose-Capillary: 163 mg/dL — ABNORMAL HIGH (ref 70–99)

## 2022-10-08 LAB — BASIC METABOLIC PANEL
Anion gap: 10 (ref 5–15)
BUN: 16 mg/dL (ref 6–20)
CO2: 25 mmol/L (ref 22–32)
Calcium: 9 mg/dL (ref 8.9–10.3)
Chloride: 99 mmol/L (ref 98–111)
Creatinine, Ser: 0.87 mg/dL (ref 0.61–1.24)
GFR, Estimated: >60 mL/min (ref >60)
Glucose, Bld: 197 mg/dL — ABNORMAL HIGH (ref 70–99)
Potassium: 3.7 mmol/L (ref 3.5–5.1)
Sodium: 134 mmol/L — ABNORMAL LOW (ref 135–145)

## 2022-10-08 MED ORDER — INSULIN GLARGINE 100 UNIT/ML SOLOSTAR PEN: 20.0000 [IU] | PEN_INJECTOR | Freq: Every day | SUBCUTANEOUS | 11 refills | Status: AC

## 2022-10-08 MED ORDER — LANCET DEVICE MISC: 1.0000 | Freq: Three times a day (TID) | 0 refills | Status: AC

## 2022-10-08 MED ORDER — DOXYCYCLINE HYCLATE 100 MG PO TABS: 100.0000 mg | ORAL_TABLET | Freq: Two times a day (BID) | ORAL | 0 refills | Status: AC

## 2022-10-08 MED ORDER — BLOOD GLUCOSE MONITORING SUPPL DEVI: 1.0000 | Freq: Three times a day (TID) | 0 refills | Status: AC

## 2022-10-08 MED ORDER — LANCETS MISC. MISC: 1.0000 | Freq: Three times a day (TID) | 0 refills | Status: AC

## 2022-10-08 MED ORDER — AMOXICILLIN-POT CLAVULANATE 875-125 MG PO TABS
1.0000 | ORAL_TABLET | Freq: Two times a day (BID) | ORAL | Status: DC
  Administered 2022-10-08: 1 via ORAL
  Filled 2022-10-08: qty 1

## 2022-10-08 MED ORDER — BLOOD GLUCOSE TEST VI STRP: 1.0000 | ORAL_STRIP | Freq: Three times a day (TID) | 0 refills | Status: AC

## 2022-10-08 MED ORDER — METFORMIN HCL 1000 MG PO TABS: 1000.0000 mg | ORAL_TABLET | Freq: Two times a day (BID) | ORAL | 3 refills | Status: AC

## 2022-10-08 MED ORDER — AMOXICILLIN-POT CLAVULANATE 875-125 MG PO TABS: 1.0000 | ORAL_TABLET | Freq: Two times a day (BID) | ORAL | 0 refills | Status: AC

## 2022-10-08 MED ORDER — DOXYCYCLINE HYCLATE 100 MG PO TABS
100.0000 mg | ORAL_TABLET | Freq: Two times a day (BID) | ORAL | Status: DC
  Administered 2022-10-08: 100 mg via ORAL
  Filled 2022-10-08: qty 1

## 2022-10-08 MED ORDER — PEN NEEDLES 31G X 5 MM MISC: 1.0000 | Freq: Every day | 3 refills | Status: AC

## 2022-10-08 NOTE — Inpatient Diabetes Management (Addendum)
Inpatient Diabetes Program Recommendations  AACE/ADA: New Consensus Statement on Inpatient Glycemic Control (2015)  Target Ranges:  Prepandial:   less than 140 mg/dL      Peak postprandial:   less than 180 mg/dL (1-2 hours)      Critically ill patients:  140 - 180 mg/dL   Lab Results  Component Value Date   GLUCAP 190 (H) 10/08/2022   HGBA1C 12.3 (H) 10/04/2022   Addendum: Spoke with patient again to reinforce insulin pen teaching. Reviewed patho of DM, survival skills, interventions, when to call MD, importance of follow up and reviewing discharge orders. No additional questions at this time.    Discharge Recommendations: Other recommendations: Metformin 1000 mg BID Long acting recommendations: Insulin Glargine (LANTUS) Solostar Pen 20 units QHS  Supply/Referral recommendations: Glucometer Test strips Lancet device Lancets Pen needles - standard Referral to Nutrition & Diab Services   Thanks, Lujean Rave, MSN, RNC-OB Diabetes Coordinator [phone] (8a-5p)

## 2022-10-08 NOTE — Discharge Summary (Signed)
Physician Discharge Summary   Patient: Brendan Stone MRN: [id number] DOB: March 02, 1966  Admit date:     10/03/2022  Discharge date: 10/08/22  Discharge Physician: Thad Ranger, MD    PCP: Patient, No Pcp Per   Recommendations at discharge:   Started on metformin 1000 mg p.o. twice daily Lantus 20 units SQ at bedtime Placed on doxycycline 100 mg p.o. twice daily for 10 more days Placed on Augmentin 875-125 milligram p.o. twice daily for 10 more days  Discharge Diagnoses:    Cellulitis of left lower extremity   Type 2 diabetes mellitus uncontrolled with hyperglycemia   Obesity   Hospital Course:  Patient is a 57 y.o. male with type 2 diabetes who presented to the ED for evaluation of persistent redness and swelling to his left leg for 1 month.  Previously seen in the ED 4/23 and diagnosed with left lower extremity cellulitis.  He was discharged on a course of doxycycline which he completed.  He says he did see some improvement in the redness on his leg after completing antibiotics however this has returned.  He has had clear weeping of his left lower leg and is seeing blistering over the erythematous area of his left lower leg.  Workup revealed left lower extremity cellulitis.  Wound care specialist was also consulted for local wound care.  Received 3 doses of IV vancomycin.  MRSA screen test returned negative.    Assessment and Plan:  Cellulitis of left lower extremity, outpatient failure to treatment, POA -Started on IV vancomycin and Rocephin.  Vancomycin DC'd on 5/14, received 3 doses.  - ABIs negative.  Venous Doppler lower extremity negative for DVT in the LLE. -Patient was on IV Rocephin, wound care. -Received Lasix 40 mg p.o. x 1 on 5/15 for the edema. -Placed on oral doxycycline 100 mg twice daily, Augmentin 1 tab p.o. twice daily for 10 more days to complete full course -Outpatient follow-up with PCP and daily wound care      Type 2 diabetes mellitus with hyperglycemia,  uncontrolled  -Hemoglobin A1c 12.3 on 10/04/2022 -Started on metformin 1000 mg p.o. twice daily, Lantus 20 units subcu at bedtime -Close follow-up with PCP and adjust insulin as needed. -If no significant improvement with above regimen in next few months, recommend adding meal coverage at that time   Obesity Estimated body mass index is 32.95 kg/m as calculated from the following:   Height as of this encounter: 5\' 8"  (1.727 m).   Weight as of this encounter: 98.3 kg.      Pain control - [employer] Controlled Substance Reporting System database was reviewed. and patient was instructed, not to drive, operate heavy machinery, perform activities at heights, swimming or participation in water activities or provide baby-sitting services while on Pain, Sleep and Anxiety Medications; until their outpatient Physician has advised to do so again. Also recommended to not to take more than prescribed Pain, Sleep and Anxiety Medications.  Consultants: none  Procedures performed: none   Disposition: Home Diet recommendation:  Discharge Diet Orders (From admission, onward)     Start     Ordered   10/08/22 0000  Diet Carb Modified        10/08/22 1057            DISCHARGE MEDICATION: Allergies as of 10/08/2022       Reactions   Tape Rash        Medication List     STOP taking these medications  doxycycline 100 MG capsule Commonly known as: VIBRAMYCIN Replaced by: doxycycline 100 MG tablet       TAKE these medications    accu-chek soft touch lancets 1 each by Misc.(Non-Drug; Combo Route) route 2 times daily.   OneTouch Delica Plus Lancet33G Misc   Allegra Hives 24HR 180 MG tablet Generic drug: fexofenadine Take 180 mg by mouth daily as needed (hives).   amoxicillin-clavulanate 875-125 MG tablet Commonly known as: AUGMENTIN Take 1 tablet by mouth 2 (two) times daily for 10 days.   Blood Glucose Monitoring Suppl Devi 1 each by Does not apply route in the morning,  at noon, and at bedtime. May substitute to any manufacturer covered by patient's insurance.   Blood Glucose Monitoring Suppl Devi 1 each by Does not apply route in the morning, at noon, and at bedtime. May substitute to any manufacturer covered by patient's insurance.   doxycycline 100 MG tablet Commonly known as: VIBRA-TABS Take 1 tablet (100 mg total) by mouth 2 (two) times daily for 10 days. Replaces: doxycycline 100 MG capsule   FreeStyle Libre 2 Reader Devi Use to test blood sugar up to 6 times daily. DX: E11.65   FreeStyle Libre 2 Sensor Misc Use to test blood sugar up to 6 times daily. DX: E11.65   ibuprofen 200 MG tablet Commonly known as: ADVIL Take 400 mg by mouth every 6 (six) hours as needed for moderate pain.   insulin glargine 100 UNIT/ML Solostar Pen Commonly known as: LANTUS Inject 20 Units into the skin at bedtime.   Lancet Device Misc 1 each by Does not apply route in the morning, at noon, and at bedtime. May substitute to any manufacturer covered by patient's insurance.   Lancet Device Misc 1 each by Does not apply route in the morning, at noon, and at bedtime. May substitute to any manufacturer covered by patient's insurance.   Lancets Misc. Misc 1 each by Does not apply route in the morning, at noon, and at bedtime. May substitute to any manufacturer covered by patient's insurance.   Lancets Misc. Misc 1 each by Does not apply route in the morning, at noon, and at bedtime. May substitute to any manufacturer covered by patient's insurance.   metFORMIN 1000 MG tablet Commonly known as: GLUCOPHAGE Take 1 tablet (1,000 mg total) by mouth 2 (two) times daily with a meal.   OneTouch Verio test strip Generic drug: glucose blood What changed: Another medication with the same name was added. Make sure you understand how and when to take each.   BLOOD GLUCOSE TEST STRIPS Strp 1 each by In Vitro route in the morning, at noon, and at bedtime. May substitute to  any manufacturer covered by patient's insurance. What changed: You were already taking a medication with the same name, and this prescription was added. Make sure you understand how and when to take each.   BLOOD GLUCOSE TEST STRIPS Strp 1 each by In Vitro route in the morning, at noon, and at bedtime. May substitute to any manufacturer covered by patient's insurance. What changed: You were already taking a medication with the same name, and this prescription was added. Make sure you understand how and when to take each.   Pen Needles 31G X 5 MM Misc 1 each by Does not apply route at bedtime. May substitute to any manufacturer covered by patient's insurance.               Discharge Care Instructions  (From admission, onward)  Start     Ordered   10/08/22 0000  Discharge wound care:       Comments: Wound care  Daily      Comments: Cover open/weeping areas of the LEs with single layer of xeroform gauze, top with kerlix and 4" ACE wrap from toes to knees. Change daily   10/08/22 1057            Follow-up Information     Practice, [facility].   Why: office requires patient to pick up form to fill out to [facility] for primary care doctor. Go to office to get form. Contact information: [address] [place] [address] [phone]                Discharge Exam: Ceasar Mons Weights   10/03/22 1601 10/03/22 2259  Weight: 99.8 kg 98.3 kg   S: Feels better, states cellulitis is improving, feels ready to go home.  BP (!) 153/78 (BP Location: Right Arm)   Pulse 79   Temp 98.4 F (36.9 C)   Resp 18   Ht 5\' 8"  (1.727 m)   Wt 98.3 kg   SpO2 98%   BMI 32.95 kg/m   Physical Exam General: Alert and oriented x 3, NAD Cardiovascular: S1 S2 clear, RRR.  Respiratory: CTAB, no wheezing, rales or rhonchi Gastrointestinal: Soft, nontender, nondistended, NBS Ext: 1+  pedal edema LLE  Neuro: no new deficits Skin: Dressing intact left lower  extremity Psych: Normal affect    Condition at discharge: fair  The results of significant diagnostics from this hospitalization (including imaging, microbiology, ancillary and laboratory) are listed below for reference.   Imaging Studies: VAS [place] ABI WITH/WO TBI  Result Date: 10/05/2022  LOWER EXTREMITY DOPPLER STUDY Patient Name:  GREELEY GARCIAPEREZ  Date of Exam:   10/05/2022 Medical Rec #: [id number]      Accession #:    [id number] Date of Birth: April 22, 1966       Patient Gender: M Patient Age:   35 years Exam [facility]  [facility] Procedure:      VAS [place] ABI WITH/WO TBI Referring Phys: CAROLE HALL --------------------------------------------------------------------------------  Indications: Ulceration. Cellulitis, weeping, blistering High Risk Factors: Hyperlipidemia, Diabetes. Other Factors: Venous stasis.  Comparison Study: No prior study Performing Technologist: Sherren Kerns RVS  Examination Guidelines: A complete evaluation includes at minimum, Doppler waveform signals and systolic blood pressure reading at the level of bilateral brachial, anterior tibial, and posterior tibial arteries, when vessel segments are accessible. Bilateral testing is considered an integral part of a complete examination. Photoelectric Plethysmograph (PPG) waveforms and toe systolic pressure readings are included as required and additional duplex testing as needed. Limited examinations for reoccurring indications may be performed as noted.  ABI Findings: +---------+------------------+-----+-----------+--------+ Right    Rt Pressure (mmHg)IndexWaveform   Comment  +---------+------------------+-----+-----------+--------+ Brachial 159                    triphasic           +---------+------------------+-----+-----------+--------+ PTA      189               1.16 multiphasic         +---------+------------------+-----+-----------+--------+ DP       193               1.18 multiphasic          +---------+------------------+-----+-----------+--------+ [facility]  1.01 Normal              +---------+------------------+-----+-----------+--------+ +---------+------------------+-----+-----------+-------+ Left     Lt Pressure (mmHg)IndexWaveform   Comment +---------+------------------+-----+-----------+-------+ Brachial 163                    triphasic          +---------+------------------+-----+-----------+-------+ PTA      188               1.15 multiphasic        +---------+------------------+-----+-----------+-------+ DP       195               1.20 multiphasic        +---------+------------------+-----+-----------+-------+ Great Toe105               0.64 Normal             +---------+------------------+-----+-----------+-------+ +-------+-----------+-----------+------------+------------+ ABI/TBIToday's ABIToday's TBIPrevious ABIPrevious TBI +-------+-----------+-----------+------------+------------+ Right  1.18       1.20                                +-------+-----------+-----------+------------+------------+ Left   1.01       0.64                                +-------+-----------+-----------+------------+------------+   Summary: Right: Resting right ankle-brachial index is within normal range. The right toe-brachial index is normal. Left: Resting left ankle-brachial index is within normal range. The left toe-brachial index is normal. *See table(s) above for measurements and observations.  Electronically signed by Sherald Hess MD on 10/05/2022 at 3:16:38 PM.    Final    US Venous Img Lower Unilateral Left (DVT)  Result Date: 09/16/2022 CLINICAL DATA:  LEFT lower extremity pain, redness and swelling for 2-3 weeks EXAM: LEFT LOWER EXTREMITY VENOUS DOPPLER ULTRASOUND TECHNIQUE: Gray-scale sonography with compression, as well as color and duplex ultrasound, were performed to evaluate the deep venous system(s) from the level of  the common femoral vein through the popliteal and proximal calf veins. COMPARISON:  None Available. FINDINGS: VENOUS Normal compressibility of the common femoral, superficial femoral, and popliteal veins, as well as the visualized calf veins. Visualized portions of profunda femoral vein and great saphenous vein unremarkable. No filling defects to suggest DVT on grayscale or color Doppler imaging. Doppler waveforms show normal direction of venous flow, normal respiratory plasticity and response to augmentation. Limited views of the contralateral common femoral vein are unremarkable. OTHER Mild subcutaneous edema LEFT calf. Normal sized LEFT inguinal lymph nodes incidentally noted. Limitations: none IMPRESSION: No evidence of deep venous thrombosis in the LEFT lower extremity. Electronically Signed   By: Ulyses Southward M.D.   On: 09/16/2022 17:10    Microbiology: Results for orders placed or performed during the hospital encounter of 10/03/22  MRSA Next Gen by PCR, Nasal     Status: None   Collection Time: 10/04/22  6:26 PM   Specimen: Nasal Mucosa; Nasal Swab  Result Value Ref Range Status   MRSA by PCR Next Gen NOT DETECTED NOT DETECTED Final    Comment: (NOTE) The GeneXpert MRSA Assay (FDA approved for NASAL specimens only), is one component of a comprehensive MRSA colonization surveillance program. It is not intended to diagnose MRSA infection nor to guide or monitor treatment for MRSA infections. Test performance is not FDA approved in patients less  than 31 years old. Performed at [facility], [address], [place], [place] [address]     Labs: CBC: Recent Labs  Lab 10/03/22 1621 10/04/22 0451 10/05/22 0438 10/07/22 0843  WBC 7.2 6.2 7.2 7.5  HGB 13.4 12.4* 13.5 14.3  HCT 39.9 36.7* 40.9 42.2  MCV 86.0 83.2 86.3 83.2  PLT 259 217 234 261   Basic Metabolic Panel: Recent Labs  Lab 10/03/22 1621 10/04/22 0451 10/05/22 0438 10/07/22 0843 10/08/22 0739  NA 136 137 135 137  134*  K 3.9 3.7 3.7 4.0 3.7  CL 102 107 103 102 99  CO2 24 24 23 25 25   GLUCOSE 341* 272* 189* 200* 197*  BUN 10 9 14 14 16   CREATININE 0.97 0.78 0.90 0.84 0.87  CALCIUM 8.8* 8.4* 8.3* 9.2 9.0  MG  --   --  2.0  --   --    Liver Function Tests: Recent Labs  Lab 10/03/22 1621  AST 17  ALT 22  ALKPHOS 80  BILITOT 0.5  PROT 6.9  ALBUMIN 3.5   CBG: Recent Labs  Lab 10/07/22 1313 10/07/22 1724 10/07/22 2121 10/08/22 0809 10/08/22 1202  GLUCAP 129* 249* 159* 190* 163*    Discharge time spent: greater than 30 minutes.  Signed: Thad Ranger, MD [facility] 10/08/2022

## 2022-10-09 ENCOUNTER — Telehealth: Payer: Self-pay | Admitting: *Deleted

## 2022-10-09 NOTE — Transitions of Care (Post Inpatient/ED Visit) (Signed)
   10/09/2022  Name: Brendan Stone MRN: [id number] DOB: [birth year]  Today's TOC FU Call Status: Today's TOC FU Call Status:: Unsuccessul Call (1st Attempt) Unsuccessful Call (1st Attempt) Date: 10/09/22  Attempted to reach the patient regarding the most recent Inpatient/ED visit.  Patient reports "no PCP" in EMR. [facility] has [facility] listed, which was his most recent PCP. Provider has since left the practice and patient hasn't established care with another provider. Chart review indicates several episodes of conflict with [facility]. Patient may not have a current PCP or may be seeking care elsewhere.   Follow Up Plan: Additional outreach attempts will be made to reach the patient to complete the Transitions of Care (Post Inpatient/ED visit) call.   Demetrios Loll, BSN, RN-BC RN Care Coordinator [facility]  [facility] Direct Dial: [phone] Main #: [phone]

## 2022-10-09 NOTE — Progress Notes (Unsigned)
  Care Coordination  Outreach Note  10/09/2022 Name: Brendan Stone MRN: [id number] DOB: 01/23/1966   Care Coordination Outreach Attempts: An unsuccessful telephone outreach was attempted today to offer the patient information about available care coordination services.  Follow Up Plan:  Additional outreach attempts will be made to offer the patient care coordination information and services.   Encounter Outcome:  No Answer  Christie Nottingham  Care Coordination Care Guide  Direct Dial: [phone]

## 2022-10-12 ENCOUNTER — Telehealth: Payer: Self-pay | Admitting: *Deleted

## 2022-10-12 NOTE — Transitions of Care (Post Inpatient/ED Visit) (Signed)
   10/12/2022  Name: Brendan Stone MRN: [id number] DOB: 1966-05-24  Today's TOC FU Call Status: Today's TOC FU Call Status:: Unsuccessful Call (2nd Attempt)  Attempted to reach the patient regarding the most recent Inpatient/ED visit.  Follow Up Plan: No further outreach attempts will be made at this time. We have been unable to contact the patient. Patient has no PCP listed and advised Scheduling Care Guide that he has left [facility] and is establishing care with [facility].   Demetrios Loll, BSN, RN-BC RN Care Coordinator [facility]  [facility] Direct Dial: [phone] Main #: [phone]

## 2022-10-12 NOTE — Progress Notes (Unsigned)
  Care Coordination  Outreach Note  10/12/2022 Name: Brendan Stone MRN: [id number] DOB: [birth year]   Care Coordination Outreach Attempts: A second unsuccessful outreach was attempted today to offer the patient with information about available care coordination services.  Follow Up Plan:  Additional outreach attempts will be made to offer the patient care coordination information and services.   Encounter Outcome:  No Answer  Christie Nottingham  Care Coordination Care Guide  Direct Dial: [phone]

## 2022-10-12 NOTE — Progress Notes (Unsigned)
  Care Coordination  Outreach Note  10/12/2022 Name: Brendan Stone MRN: [id number] DOB: 04/08/1966   Care Coordination Outreach Attempts: A second unsuccessful outreach was attempted today to offer the patient with information about available care coordination services.  Follow Up Plan:  Additional outreach attempts will be made to offer the patient care coordination information and services.   Encounter Outcome:  No Answer  Sig Faigy Stretch  Care Coordination Care Guide  Direct Dial: [phone]  

## 2022-10-13 NOTE — Progress Notes (Signed)
  Care Coordination  Outreach Note  10/13/2022 Name: TOYA KUCZEK MRN: [id number] DOB: Jul 30, 1965   Care Coordination Outreach Attempts: A third unsuccessful outreach was attempted today to offer the patient with information about available care coordination services.  Follow Up Plan:  No further outreach attempts will be made at this time. We have been unable to contact the patient to offer or enroll patient in care coordination services  Encounter Outcome:  No Answer  Christie Nottingham  Care Coordination Care Guide  Direct Dial: [phone]

## 2022-10-14 ENCOUNTER — Ambulatory Visit: Payer: BC Managed Care – PPO | Admitting: Student
